# Patient Record
Sex: Female | Born: 1940 | Race: White | Hispanic: No | Smoking: Never smoker
Health system: Southern US, Community
[De-identification: ages and names within clinical notes are randomized; demographics above are authoritative.]

## PROBLEM LIST (undated history)

## (undated) DIAGNOSIS — D351 Benign neoplasm of parathyroid gland: Secondary | ICD-10-CM

## (undated) DIAGNOSIS — K2270 Barrett's esophagus without dysplasia: Secondary | ICD-10-CM

## (undated) DIAGNOSIS — K219 Gastro-esophageal reflux disease without esophagitis: Secondary | ICD-10-CM

## (undated) DIAGNOSIS — Z9882 Breast implant status: Secondary | ICD-10-CM

## (undated) DIAGNOSIS — K635 Polyp of colon: Secondary | ICD-10-CM

## (undated) DIAGNOSIS — J449 Chronic obstructive pulmonary disease, unspecified: Secondary | ICD-10-CM

## (undated) DIAGNOSIS — I10 Essential (primary) hypertension: Secondary | ICD-10-CM

## (undated) DIAGNOSIS — E785 Hyperlipidemia, unspecified: Secondary | ICD-10-CM

## (undated) HISTORY — PX: UPPER GI ENDOSCOPY: SHX6162

## (undated) HISTORY — PX: HEMORRHOID SURGERY: SHX153

## (undated) HISTORY — DX: Barrett's esophagus without dysplasia: K22.70

## (undated) HISTORY — DX: Chronic obstructive pulmonary disease, unspecified: J44.9

## (undated) HISTORY — DX: Hyperlipidemia, unspecified: E78.5

## (undated) HISTORY — DX: Polyp of colon: K63.5

## (undated) HISTORY — DX: Essential (primary) hypertension: I10

## (undated) HISTORY — DX: Breast implant status: Z98.82

## (undated) HISTORY — PX: PLACEMENT OF BREAST IMPLANTS: SHX6334

## (undated) HISTORY — PX: PARATHYROIDECTOMY: SHX19

## (undated) HISTORY — DX: Benign neoplasm of parathyroid gland: D35.1

## (undated) HISTORY — DX: Gastro-esophageal reflux disease without esophagitis: K21.9

---

## 1998-11-27 ENCOUNTER — Other Ambulatory Visit: Admission: RE | Admit: 1998-11-27 | Discharge: 1998-11-27 | Payer: Self-pay | Admitting: *Deleted

## 1998-12-15 ENCOUNTER — Encounter: Payer: Self-pay | Admitting: *Deleted

## 1998-12-17 ENCOUNTER — Ambulatory Visit (HOSPITAL_COMMUNITY): Admission: RE | Admit: 1998-12-17 | Discharge: 1998-12-17 | Payer: Self-pay | Admitting: *Deleted

## 2007-10-02 ENCOUNTER — Ambulatory Visit: Payer: Self-pay | Admitting: Physician Assistant

## 2007-12-25 ENCOUNTER — Ambulatory Visit: Payer: Self-pay | Admitting: Unknown Physician Specialty

## 2009-02-16 ENCOUNTER — Ambulatory Visit: Payer: Self-pay | Admitting: Physician Assistant

## 2010-03-26 ENCOUNTER — Ambulatory Visit: Payer: Self-pay | Admitting: Physician Assistant

## 2010-12-06 ENCOUNTER — Ambulatory Visit: Payer: Self-pay | Admitting: Family Medicine

## 2013-05-23 HISTORY — PX: COLONOSCOPY: SHX174

## 2013-08-08 ENCOUNTER — Ambulatory Visit: Payer: Self-pay | Admitting: Unknown Physician Specialty

## 2013-08-10 LAB — PATHOLOGY REPORT

## 2013-10-25 ENCOUNTER — Ambulatory Visit: Payer: Self-pay | Admitting: Physician Assistant

## 2014-06-03 DIAGNOSIS — H524 Presbyopia: Secondary | ICD-10-CM | POA: Diagnosis not present

## 2014-06-03 DIAGNOSIS — H35033 Hypertensive retinopathy, bilateral: Secondary | ICD-10-CM | POA: Diagnosis not present

## 2014-06-03 DIAGNOSIS — H40013 Open angle with borderline findings, low risk, bilateral: Secondary | ICD-10-CM | POA: Diagnosis not present

## 2014-06-03 DIAGNOSIS — H25013 Cortical age-related cataract, bilateral: Secondary | ICD-10-CM | POA: Diagnosis not present

## 2014-06-03 DIAGNOSIS — H2513 Age-related nuclear cataract, bilateral: Secondary | ICD-10-CM | POA: Diagnosis not present

## 2014-07-22 DIAGNOSIS — H02839 Dermatochalasis of unspecified eye, unspecified eyelid: Secondary | ICD-10-CM | POA: Diagnosis not present

## 2014-07-22 DIAGNOSIS — H18412 Arcus senilis, left eye: Secondary | ICD-10-CM | POA: Diagnosis not present

## 2014-07-22 DIAGNOSIS — H2511 Age-related nuclear cataract, right eye: Secondary | ICD-10-CM | POA: Diagnosis not present

## 2014-07-22 DIAGNOSIS — H18411 Arcus senilis, right eye: Secondary | ICD-10-CM | POA: Diagnosis not present

## 2014-09-04 DIAGNOSIS — Z9181 History of falling: Secondary | ICD-10-CM | POA: Diagnosis not present

## 2014-09-04 DIAGNOSIS — E782 Mixed hyperlipidemia: Secondary | ICD-10-CM | POA: Diagnosis not present

## 2014-09-04 DIAGNOSIS — B0229 Other postherpetic nervous system involvement: Secondary | ICD-10-CM | POA: Diagnosis not present

## 2014-09-04 DIAGNOSIS — I10 Essential (primary) hypertension: Secondary | ICD-10-CM | POA: Diagnosis not present

## 2014-09-04 DIAGNOSIS — K2270 Barrett's esophagus without dysplasia: Secondary | ICD-10-CM | POA: Diagnosis not present

## 2014-09-04 DIAGNOSIS — Z79899 Other long term (current) drug therapy: Secondary | ICD-10-CM | POA: Diagnosis not present

## 2014-09-04 DIAGNOSIS — Z1389 Encounter for screening for other disorder: Secondary | ICD-10-CM | POA: Diagnosis not present

## 2014-09-08 DIAGNOSIS — H25011 Cortical age-related cataract, right eye: Secondary | ICD-10-CM | POA: Diagnosis not present

## 2014-09-08 DIAGNOSIS — H2511 Age-related nuclear cataract, right eye: Secondary | ICD-10-CM | POA: Diagnosis not present

## 2014-09-08 DIAGNOSIS — H52201 Unspecified astigmatism, right eye: Secondary | ICD-10-CM | POA: Diagnosis not present

## 2014-09-08 DIAGNOSIS — H269 Unspecified cataract: Secondary | ICD-10-CM | POA: Diagnosis not present

## 2014-09-09 DIAGNOSIS — H2512 Age-related nuclear cataract, left eye: Secondary | ICD-10-CM | POA: Diagnosis not present

## 2014-09-26 DIAGNOSIS — H269 Unspecified cataract: Secondary | ICD-10-CM | POA: Diagnosis not present

## 2014-09-26 DIAGNOSIS — H2513 Age-related nuclear cataract, bilateral: Secondary | ICD-10-CM | POA: Diagnosis not present

## 2014-09-26 DIAGNOSIS — H2512 Age-related nuclear cataract, left eye: Secondary | ICD-10-CM | POA: Diagnosis not present

## 2014-09-26 DIAGNOSIS — H52202 Unspecified astigmatism, left eye: Secondary | ICD-10-CM | POA: Diagnosis not present

## 2015-03-11 DIAGNOSIS — Z139 Encounter for screening, unspecified: Secondary | ICD-10-CM | POA: Diagnosis not present

## 2015-03-11 DIAGNOSIS — Z23 Encounter for immunization: Secondary | ICD-10-CM | POA: Diagnosis not present

## 2015-03-11 DIAGNOSIS — I10 Essential (primary) hypertension: Secondary | ICD-10-CM | POA: Diagnosis not present

## 2015-03-11 DIAGNOSIS — Z1389 Encounter for screening for other disorder: Secondary | ICD-10-CM | POA: Diagnosis not present

## 2015-03-11 DIAGNOSIS — Z0000 Encounter for general adult medical examination without abnormal findings: Secondary | ICD-10-CM | POA: Diagnosis not present

## 2015-03-11 DIAGNOSIS — E782 Mixed hyperlipidemia: Secondary | ICD-10-CM | POA: Diagnosis not present

## 2015-03-11 DIAGNOSIS — Z6831 Body mass index (BMI) 31.0-31.9, adult: Secondary | ICD-10-CM | POA: Diagnosis not present

## 2015-09-11 DIAGNOSIS — E663 Overweight: Secondary | ICD-10-CM | POA: Diagnosis not present

## 2015-09-11 DIAGNOSIS — E782 Mixed hyperlipidemia: Secondary | ICD-10-CM | POA: Diagnosis not present

## 2015-09-11 DIAGNOSIS — Z79899 Other long term (current) drug therapy: Secondary | ICD-10-CM | POA: Diagnosis not present

## 2015-09-11 DIAGNOSIS — K2270 Barrett's esophagus without dysplasia: Secondary | ICD-10-CM | POA: Diagnosis not present

## 2015-09-11 DIAGNOSIS — Z6829 Body mass index (BMI) 29.0-29.9, adult: Secondary | ICD-10-CM | POA: Diagnosis not present

## 2015-09-11 DIAGNOSIS — B0229 Other postherpetic nervous system involvement: Secondary | ICD-10-CM | POA: Diagnosis not present

## 2015-09-11 DIAGNOSIS — I10 Essential (primary) hypertension: Secondary | ICD-10-CM | POA: Diagnosis not present

## 2015-10-14 DIAGNOSIS — D72819 Decreased white blood cell count, unspecified: Secondary | ICD-10-CM | POA: Diagnosis not present

## 2016-03-23 DIAGNOSIS — I10 Essential (primary) hypertension: Secondary | ICD-10-CM | POA: Diagnosis not present

## 2016-03-23 DIAGNOSIS — B0229 Other postherpetic nervous system involvement: Secondary | ICD-10-CM | POA: Diagnosis not present

## 2016-03-23 DIAGNOSIS — Z79899 Other long term (current) drug therapy: Secondary | ICD-10-CM | POA: Diagnosis not present

## 2016-03-23 DIAGNOSIS — Z9181 History of falling: Secondary | ICD-10-CM | POA: Diagnosis not present

## 2016-03-23 DIAGNOSIS — E782 Mixed hyperlipidemia: Secondary | ICD-10-CM | POA: Diagnosis not present

## 2016-03-23 DIAGNOSIS — Z6830 Body mass index (BMI) 30.0-30.9, adult: Secondary | ICD-10-CM | POA: Diagnosis not present

## 2016-03-23 DIAGNOSIS — M542 Cervicalgia: Secondary | ICD-10-CM | POA: Diagnosis not present

## 2016-03-23 DIAGNOSIS — Z0000 Encounter for general adult medical examination without abnormal findings: Secondary | ICD-10-CM | POA: Diagnosis not present

## 2016-03-23 DIAGNOSIS — K2270 Barrett's esophagus without dysplasia: Secondary | ICD-10-CM | POA: Diagnosis not present

## 2016-03-23 DIAGNOSIS — Z1389 Encounter for screening for other disorder: Secondary | ICD-10-CM | POA: Diagnosis not present

## 2016-03-23 DIAGNOSIS — Z23 Encounter for immunization: Secondary | ICD-10-CM | POA: Diagnosis not present

## 2016-03-23 DIAGNOSIS — Z7689 Persons encountering health services in other specified circumstances: Secondary | ICD-10-CM | POA: Diagnosis not present

## 2016-03-23 DIAGNOSIS — Z139 Encounter for screening, unspecified: Secondary | ICD-10-CM | POA: Diagnosis not present

## 2016-04-27 DIAGNOSIS — E611 Iron deficiency: Secondary | ICD-10-CM | POA: Diagnosis not present

## 2016-09-21 DIAGNOSIS — Z79899 Other long term (current) drug therapy: Secondary | ICD-10-CM | POA: Diagnosis not present

## 2016-09-21 DIAGNOSIS — K2270 Barrett's esophagus without dysplasia: Secondary | ICD-10-CM | POA: Diagnosis not present

## 2016-09-21 DIAGNOSIS — I10 Essential (primary) hypertension: Secondary | ICD-10-CM | POA: Diagnosis not present

## 2016-09-21 DIAGNOSIS — M542 Cervicalgia: Secondary | ICD-10-CM | POA: Diagnosis not present

## 2016-09-21 DIAGNOSIS — B0229 Other postherpetic nervous system involvement: Secondary | ICD-10-CM | POA: Diagnosis not present

## 2016-09-21 DIAGNOSIS — E782 Mixed hyperlipidemia: Secondary | ICD-10-CM | POA: Diagnosis not present

## 2016-12-19 DIAGNOSIS — L729 Follicular cyst of the skin and subcutaneous tissue, unspecified: Secondary | ICD-10-CM | POA: Diagnosis not present

## 2016-12-19 DIAGNOSIS — Z6829 Body mass index (BMI) 29.0-29.9, adult: Secondary | ICD-10-CM | POA: Diagnosis not present

## 2016-12-20 ENCOUNTER — Encounter: Payer: Self-pay | Admitting: *Deleted

## 2016-12-22 ENCOUNTER — Ambulatory Visit (INDEPENDENT_AMBULATORY_CARE_PROVIDER_SITE_OTHER): Payer: PPO | Admitting: General Surgery

## 2016-12-22 ENCOUNTER — Encounter: Payer: Self-pay | Admitting: General Surgery

## 2016-12-22 VITALS — BP 124/82 | HR 87 | Resp 14 | Ht 64.0 in | Wt 167.0 lb

## 2016-12-22 DIAGNOSIS — L02212 Cutaneous abscess of back [any part, except buttock]: Secondary | ICD-10-CM | POA: Diagnosis not present

## 2016-12-22 NOTE — Patient Instructions (Addendum)
Use prescription pain medication if needed. Use Ibuprofen twice daily for the next several days.  Change dressing daily after your shower. May use plain gauze and tape in place. We will call with culture results. Follow up in 10 days.

## 2016-12-22 NOTE — Progress Notes (Signed)
Patient ID: Cindy Macdonald, female   DOB: 1940/08/26, 76 y.o.   MRN: [id number]  Chief Complaint  Patient presents with  . Other    back cyst    HPI Cindy Macdonald is a 76 y.o. female she is here for assessment of a cyst on her back. It was small and there for several years. On 12/15/16 it got bigger and started to expand. Very painful. She was seen by her PCP on 12/19/16 and placed on an antibiotic. She reports no drainage. She is here today with her sister.    HPI  Past Medical History:  Diagnosis Date  . Barrett's esophagus   . Benign tumor of parathyroid gland   . Colon polyps   . COPD (chronic obstructive pulmonary disease) ([place])   . GERD (gastroesophageal reflux disease)   . Hyperlipidemia   . Hypertension     Past Surgical History:  Procedure Laterality Date  . COLONOSCOPY  2015   Dr [name]  . UPPER GI ENDOSCOPY     dr Vira Agar    Family History  Problem Relation Age of Onset  . Lung cancer Father   . Brain cancer Brother   . Lung cancer Paternal Grandfather   . Lymphoma Sister   . Leukemia Sister   . Cancer Brother   . Lymphoma Brother     Social History Social History  Substance Use Topics  . Smoking status: Not on file  . Smokeless tobacco: Not on file  . Alcohol use Not on file    Allergies  Allergen Reactions  . Ace Inhibitors   . Codeine   . Other     Darvocet   . Sulfa Antibiotics     Current Outpatient Prescriptions  Medication Sig Dispense Refill  . albuterol (PROVENTIL HFA;VENTOLIN HFA) 108 (90 Base) MCG/ACT inhaler Inhale into the lungs every 6 (six) hours as needed for wheezing or shortness of breath.    Marland Kitchen amitriptyline (ELAVIL) 25 MG tablet Take 25 mg by mouth at bedtime.    Marland Kitchen amLODipine (NORVASC) 10 MG tablet Take 10 mg by mouth daily.    . ferrous sulfate 325 (65 FE) MG tablet Take 325 mg by mouth daily with breakfast.    . gabapentin (NEURONTIN) 600 MG tablet Take 600 mg by mouth 2 (two) times daily.    . hydrochlorothiazide  (HYDRODIURIL) 50 MG tablet Take 50 mg by mouth daily.    . lansoprazole (PREVACID) 30 MG capsule Take 30 mg by mouth daily at 12 noon.    Marland Kitchen losartan (COZAAR) 100 MG tablet Take 100 mg by mouth daily.    . metoprolol tartrate (LOPRESSOR) 100 MG tablet Take 100 mg by mouth 2 (two) times daily.    . pravastatin (PRAVACHOL) 40 MG tablet Take 40 mg by mouth daily.    Marland Kitchen tiZANidine (ZANAFLEX) 4 MG tablet Take 4 mg by mouth every 6 (six) hours as needed for muscle spasms.    . traMADol (ULTRAM) 50 MG tablet Take 50 mg by mouth every 6 (six) hours as needed.    Marland Kitchen amoxicillin-clavulanate (AUGMENTIN) 875-125 MG tablet      No current facility-administered medications for this visit.     Review of Systems Review of Systems  Constitutional: Negative.   Respiratory: Negative.   Cardiovascular: Negative.     Blood pressure 124/82, pulse 87, resp. rate 14, height 5\' 4"  (1.626 m), weight 167 lb (75.8 kg).  Physical Exam Physical Exam  Constitutional: She is oriented to person,  place, and time. She appears well-developed and well-nourished.  Eyes: Conjunctivae are normal. No scleral icterus.  Neck: Neck supple.  Cardiovascular: Normal rate, regular rhythm and normal heart sounds.   Pulmonary/Chest: Effort normal and breath sounds normal.  Lymphadenopathy:    She has no cervical adenopathy.    She has no axillary adenopathy.  Neurological: She is alert and oriented to person, place, and time.  Skin: Skin is warm and dry.        Psychiatric: She has a normal mood and affect.    Data Reviewed    Assessment    Abscess of mid upper back, history suggestive of prior cyst in this location.     Plan   recommended drainage of the abscess and the patient was agreeable.  The most fluctuant part with alcohol prep a 3 mL of 0.5% Marcaine mixed with 1% Xylocaine was instilled. Sterile towels were placed. 1 cm cruciate incision was made and the corners were trimmed off to allow for drainage of from  yellowish-white pus approximately 10-15 mL. The cavity was irrigated with saline. Culture obtained. Area dressed with 4 x 4's and tape. Patient to continue with the Augmentin which she is on now. This can be adjusted based on culture report Prescription given for Tramadol    HPI, Physical Exam, Assessment and Plan have been scribed under the direction and in the presence of Mckinley Jewel, MD  Concepcion Living, LPN I have completed the exam and reviewed the above documentation for accuracy and completeness.  I agree with the above.  Haematologist has been used and any errors in dictation or transcription are unintentional.  Seeplaputhur G. Jamal Collin, M.D., F.A.C.S.    Junie Panning G 12/23/2016, 8:56 AM

## 2016-12-29 LAB — ANAEROBIC AND AEROBIC CULTURE

## 2017-01-03 ENCOUNTER — Encounter: Payer: Self-pay | Admitting: General Surgery

## 2017-01-03 ENCOUNTER — Ambulatory Visit (INDEPENDENT_AMBULATORY_CARE_PROVIDER_SITE_OTHER): Payer: PPO | Admitting: General Surgery

## 2017-01-03 VITALS — BP 122/80 | HR 76 | Resp 12 | Ht 64.0 in | Wt 169.0 lb

## 2017-01-03 DIAGNOSIS — L02212 Cutaneous abscess of back [any part, except buttock]: Secondary | ICD-10-CM | POA: Diagnosis not present

## 2017-01-03 NOTE — Progress Notes (Signed)
Patient ID: Cindy Macdonald, female   DOB: 1941/03/15, 76 y.o.   MRN: [id number]  Chief Complaint  Patient presents with  . Follow-up    HPI Cindy Macdonald is a 76 y.o. female here today for her 2 week follow up back abscess. Patient states the area is draining. She states there is another area that has opened up right beside the one that was drained.   HPI  Past Medical History:  Diagnosis Date  . Barrett's esophagus   . Benign tumor of parathyroid gland   . Colon polyps   . COPD (chronic obstructive pulmonary disease) ([place])   . GERD (gastroesophageal reflux disease)   . Hyperlipidemia   . Hypertension     Past Surgical History:  Procedure Laterality Date  . COLONOSCOPY  2015   Dr [name]  . UPPER GI ENDOSCOPY     dr Vira Agar    Family History  Problem Relation Age of Onset  . Lung cancer Father   . Brain cancer Brother   . Lung cancer Paternal Grandfather   . Lymphoma Sister   . Leukemia Sister   . Cancer Brother   . Lymphoma Brother     Social History Social History  Substance Use Topics  . Smoking status: Never Smoker  . Smokeless tobacco: Never Used  . Alcohol use No    Allergies  Allergen Reactions  . Ace Inhibitors   . Codeine   . Other     Darvocet   . Sulfa Antibiotics     Current Outpatient Prescriptions  Medication Sig Dispense Refill  . albuterol (PROVENTIL HFA;VENTOLIN HFA) 108 (90 Base) MCG/ACT inhaler Inhale into the lungs every 6 (six) hours as needed for wheezing or shortness of breath.    Marland Kitchen amitriptyline (ELAVIL) 25 MG tablet Take 25 mg by mouth at bedtime.    Marland Kitchen amLODipine (NORVASC) 10 MG tablet Take 10 mg by mouth daily.    Marland Kitchen amoxicillin-clavulanate (AUGMENTIN) 875-125 MG tablet     . ferrous sulfate 325 (65 FE) MG tablet Take 325 mg by mouth daily with breakfast.    . gabapentin (NEURONTIN) 600 MG tablet Take 600 mg by mouth 2 (two) times daily.    . hydrochlorothiazide (HYDRODIURIL) 50 MG tablet Take 50 mg by mouth daily.    .  lansoprazole (PREVACID) 30 MG capsule Take 30 mg by mouth daily at 12 noon.    Marland Kitchen losartan (COZAAR) 100 MG tablet Take 100 mg by mouth daily.    . metoprolol tartrate (LOPRESSOR) 100 MG tablet Take 100 mg by mouth 2 (two) times daily.    . pravastatin (PRAVACHOL) 40 MG tablet Take 40 mg by mouth daily.    Marland Kitchen tiZANidine (ZANAFLEX) 4 MG tablet Take 4 mg by mouth every 6 (six) hours as needed for muscle spasms.    . traMADol (ULTRAM) 50 MG tablet Take 50 mg by mouth every 6 (six) hours as needed.     No current facility-administered medications for this visit.     Review of Systems Review of Systems  Constitutional: Negative.   Respiratory: Negative.     Blood pressure 122/80, pulse 76, resp. rate 12, height 5\' 4"  (1.626 m), weight 169 lb (76.7 kg).  Physical Exam Physical Exam  Constitutional: She is oriented to person, place, and time. She appears well-developed and well-nourished.  Pulmonary/Chest:      Neurological: She is alert and oriented to person, place, and time.  Skin: Skin is warm and dry.  Data Reviewed Prior notes reviewed   Assessment    Abscess of back - culture grew out streptococcus, pt has finished course of amoxicillin. Area continues to drain small amounts of purulent fluid. Recommend that pt return in 4-5 weeks to evaluate for possible excision once inflammation has subsided.       Plan   Return in 4-5 weeks for reevaluation and possible excision  Pt to keep area clean, use hot compresses      HPI, Physical Exam, Assessment and Plan have been scribed under the direction and in the presence of Mckinley Jewel, MD  Gaspar Cola, CMA   [facility] G 01/03/2017, 11:51 AM

## 2017-01-03 NOTE — Patient Instructions (Addendum)
Return in 4-5 weeks for reevaluation and possible excision   Keep area clean, use hot compresses

## 2017-02-07 ENCOUNTER — Ambulatory Visit (INDEPENDENT_AMBULATORY_CARE_PROVIDER_SITE_OTHER): Payer: PPO | Admitting: General Surgery

## 2017-02-07 ENCOUNTER — Encounter: Payer: Self-pay | Admitting: General Surgery

## 2017-02-07 VITALS — BP 126/80 | HR 70 | Resp 12 | Ht 64.0 in | Wt 170.0 lb

## 2017-02-07 DIAGNOSIS — L729 Follicular cyst of the skin and subcutaneous tissue, unspecified: Secondary | ICD-10-CM

## 2017-02-07 NOTE — Progress Notes (Signed)
Patient ID: Cindy Macdonald, female   DOB: Oct 11, 1940, 76 y.o.   MRN: [id number]  Chief Complaint  Patient presents with  . Routine Post Op    HPI Cindy Macdonald is a 76 y.o. female.  Here today for follow up back abscess that was drained on 12/22/2016.  She states the area is healed and feels good.  HPI  Past Medical History:  Diagnosis Date  . Barrett's esophagus   . Benign tumor of parathyroid gland   . Colon polyps   . COPD (chronic obstructive pulmonary disease) ([place])   . GERD (gastroesophageal reflux disease)   . Hyperlipidemia   . Hypertension     Past Surgical History:  Procedure Laterality Date  . COLONOSCOPY  2015   Dr [name]  . UPPER GI ENDOSCOPY     dr Vira Agar    Family History  Problem Relation Age of Onset  . Lung cancer Father   . Brain cancer Brother   . Lung cancer Paternal Grandfather   . Lymphoma Sister   . Leukemia Sister   . Cancer Brother   . Lymphoma Brother     Social History Social History  Substance Use Topics  . Smoking status: Never Smoker  . Smokeless tobacco: Never Used  . Alcohol use No    Allergies  Allergen Reactions  . Ace Inhibitors   . Codeine   . Other     Darvocet   . Sulfa Antibiotics     Current Outpatient Prescriptions  Medication Sig Dispense Refill  . albuterol (PROVENTIL HFA;VENTOLIN HFA) 108 (90 Base) MCG/ACT inhaler Inhale into the lungs every 6 (six) hours as needed for wheezing or shortness of breath.    Marland Kitchen amitriptyline (ELAVIL) 25 MG tablet Take 25 mg by mouth at bedtime.    Marland Kitchen amLODipine (NORVASC) 10 MG tablet Take 10 mg by mouth daily.    . ferrous sulfate 325 (65 FE) MG tablet Take 325 mg by mouth daily with breakfast.    . gabapentin (NEURONTIN) 600 MG tablet Take 600 mg by mouth 2 (two) times daily.    . hydrochlorothiazide (HYDRODIURIL) 50 MG tablet Take 50 mg by mouth daily.    . lansoprazole (PREVACID) 30 MG capsule Take 30 mg by mouth daily at 12 noon.    Marland Kitchen losartan (COZAAR) 100 MG tablet Take 100  mg by mouth daily.    . metoprolol tartrate (LOPRESSOR) 100 MG tablet Take 100 mg by mouth 2 (two) times daily.    . pravastatin (PRAVACHOL) 40 MG tablet Take 40 mg by mouth daily.    Marland Kitchen tiZANidine (ZANAFLEX) 4 MG tablet Take 4 mg by mouth every 6 (six) hours as needed for muscle spasms.    . traMADol (ULTRAM) 50 MG tablet Take 50 mg by mouth every 6 (six) hours as needed.     No current facility-administered medications for this visit.     Review of Systems Review of Systems  Constitutional: Negative.   Respiratory: Negative.   Cardiovascular: Negative.     Blood pressure 126/80, pulse 70, resp. rate 12, height 5\' 4"  (1.626 m), weight 170 lb (77.1 kg).  Physical Exam Physical Exam  Constitutional: She is oriented to person, place, and time. She appears well-developed and well-nourished.  Pulmonary/Chest:      Neurological: She is alert and oriented to person, place, and time.  Skin: Skin is warm and dry.    Data Reviewed Prior notes reviewed  Assessment    Back cyst with abscess,  post drainage, well healed. No residual cyst palpated today.     Plan      Return to clinic if patient feels recurrence of cyst.    I have completed the exam and reviewed the above documentation for accuracy and completeness.  I agree with the above.  Haematologist has been used and any errors in dictation or transcription are unintentional.  Taishawn Smaldone G. Jamal Collin, M.D., F.A.C.S.   Junie Panning G 02/07/2017, 4:24 PM

## 2017-02-07 NOTE — Patient Instructions (Signed)
Return if needed

## 2017-04-10 DIAGNOSIS — E782 Mixed hyperlipidemia: Secondary | ICD-10-CM | POA: Diagnosis not present

## 2017-04-10 DIAGNOSIS — K2270 Barrett's esophagus without dysplasia: Secondary | ICD-10-CM | POA: Diagnosis not present

## 2017-04-10 DIAGNOSIS — M542 Cervicalgia: Secondary | ICD-10-CM | POA: Diagnosis not present

## 2017-04-10 DIAGNOSIS — E611 Iron deficiency: Secondary | ICD-10-CM | POA: Diagnosis not present

## 2017-04-10 DIAGNOSIS — I10 Essential (primary) hypertension: Secondary | ICD-10-CM | POA: Diagnosis not present

## 2017-04-10 DIAGNOSIS — Z6829 Body mass index (BMI) 29.0-29.9, adult: Secondary | ICD-10-CM | POA: Diagnosis not present

## 2017-04-10 DIAGNOSIS — B0229 Other postherpetic nervous system involvement: Secondary | ICD-10-CM | POA: Diagnosis not present

## 2017-04-10 DIAGNOSIS — Z23 Encounter for immunization: Secondary | ICD-10-CM | POA: Diagnosis not present

## 2017-09-19 DIAGNOSIS — Z1331 Encounter for screening for depression: Secondary | ICD-10-CM | POA: Diagnosis not present

## 2017-09-19 DIAGNOSIS — Z9181 History of falling: Secondary | ICD-10-CM | POA: Diagnosis not present

## 2017-09-19 DIAGNOSIS — E782 Mixed hyperlipidemia: Secondary | ICD-10-CM | POA: Diagnosis not present

## 2017-09-19 DIAGNOSIS — Z139 Encounter for screening, unspecified: Secondary | ICD-10-CM | POA: Diagnosis not present

## 2017-09-19 DIAGNOSIS — Z7689 Persons encountering health services in other specified circumstances: Secondary | ICD-10-CM | POA: Diagnosis not present

## 2017-09-19 DIAGNOSIS — I10 Essential (primary) hypertension: Secondary | ICD-10-CM | POA: Diagnosis not present

## 2017-09-19 DIAGNOSIS — Z1231 Encounter for screening mammogram for malignant neoplasm of breast: Secondary | ICD-10-CM | POA: Diagnosis not present

## 2017-09-25 DIAGNOSIS — K449 Diaphragmatic hernia without obstruction or gangrene: Secondary | ICD-10-CM | POA: Diagnosis not present

## 2017-09-25 DIAGNOSIS — I10 Essential (primary) hypertension: Secondary | ICD-10-CM | POA: Diagnosis not present

## 2017-09-25 DIAGNOSIS — K8020 Calculus of gallbladder without cholecystitis without obstruction: Secondary | ICD-10-CM | POA: Diagnosis not present

## 2017-09-27 ENCOUNTER — Emergency Department (HOSPITAL_COMMUNITY)
Admission: EM | Admit: 2017-09-27 | Discharge: 2017-09-27 | Disposition: A | Discharge status: Home / Self Care | Payer: PPO | Attending: Emergency Medicine | Admitting: Emergency Medicine

## 2017-09-27 ENCOUNTER — Emergency Department (HOSPITAL_COMMUNITY): Payer: PPO

## 2017-09-27 ENCOUNTER — Encounter (HOSPITAL_COMMUNITY): Payer: Self-pay | Admitting: Emergency Medicine

## 2017-09-27 DIAGNOSIS — R5383 Other fatigue: Secondary | ICD-10-CM | POA: Diagnosis not present

## 2017-09-27 DIAGNOSIS — J449 Chronic obstructive pulmonary disease, unspecified: Secondary | ICD-10-CM | POA: Diagnosis not present

## 2017-09-27 DIAGNOSIS — Z79899 Other long term (current) drug therapy: Secondary | ICD-10-CM | POA: Diagnosis not present

## 2017-09-27 DIAGNOSIS — R531 Weakness: Secondary | ICD-10-CM | POA: Diagnosis not present

## 2017-09-27 DIAGNOSIS — R110 Nausea: Secondary | ICD-10-CM | POA: Diagnosis not present

## 2017-09-27 DIAGNOSIS — I10 Essential (primary) hypertension: Secondary | ICD-10-CM | POA: Diagnosis not present

## 2017-09-27 DIAGNOSIS — R404 Transient alteration of awareness: Secondary | ICD-10-CM | POA: Diagnosis not present

## 2017-09-27 DIAGNOSIS — R0602 Shortness of breath: Secondary | ICD-10-CM | POA: Diagnosis not present

## 2017-09-27 LAB — COMPREHENSIVE METABOLIC PANEL
ALBUMIN: 4.5 g/dL (ref 3.5–5.0)
ALK PHOS: 58 U/L (ref 38–126)
ALT: 19 U/L (ref 14–54)
AST: 23 U/L (ref 15–41)
Anion gap: 10 (ref 5–15)
BILIRUBIN TOTAL: 0.6 mg/dL (ref 0.3–1.2)
BUN: 11 mg/dL (ref 6–20)
CALCIUM: 9.8 mg/dL (ref 8.9–10.3)
CO2: 27 mmol/L (ref 22–32)
Chloride: 99 mmol/L — ABNORMAL LOW (ref 101–111)
Creatinine, Ser: 0.73 mg/dL (ref 0.44–1.00)
GFR calc Af Amer: >60 mL/min (ref >60)
GFR calc non Af Amer: >60 mL/min (ref >60)
GLUCOSE: 107 mg/dL — AB (ref 65–99)
Potassium: 3.5 mmol/L (ref 3.5–5.1)
Sodium: 136 mmol/L (ref 135–145)
TOTAL PROTEIN: 7.5 g/dL (ref 6.5–8.1)

## 2017-09-27 LAB — URINALYSIS, ROUTINE W REFLEX MICROSCOPIC
Bilirubin Urine: NEGATIVE
GLUCOSE, UA: NEGATIVE mg/dL
Hgb urine dipstick: NEGATIVE
KETONES UR: 5 mg/dL — AB
LEUKOCYTES UA: NEGATIVE
NITRITE: NEGATIVE
PH: 8 (ref 5.0–8.0)
Protein, ur: NEGATIVE mg/dL
SPECIFIC GRAVITY, URINE: 1.004 — AB (ref 1.005–1.030)

## 2017-09-27 LAB — CBC
HEMATOCRIT: 43.6 % (ref 36.0–46.0)
Hemoglobin: 14.3 g/dL (ref 12.0–15.0)
MCH: 27.8 pg (ref 26.0–34.0)
MCHC: 32.8 g/dL (ref 30.0–36.0)
MCV: 84.7 fL (ref 78.0–100.0)
Platelets: 294 10*3/uL (ref 150–400)
RBC: 5.15 MIL/uL — ABNORMAL HIGH (ref 3.87–5.11)
RDW: 13.7 % (ref 11.5–15.5)
WBC: 3.2 10*3/uL — ABNORMAL LOW (ref 4.0–10.5)

## 2017-09-27 NOTE — ED Provider Notes (Signed)
[facility] EM[facility] Provider Note   CSN: [id number] Arrival date & time: 09/27/17  1959  History   Chief Complaint Chief Complaint  Patient presents with  . Gen. Weakness/Fatigue    Hypertensive    HPI Cindy Macdonald is a 77 y.o. female with a past medical history of hypertension, COPD, hyperlipidemia, GERD who presented to the ED with complaints of generalized fatigue and weakness.  Daughter present who assists with history  She reports progressive general symptoms of fatigue, nausea, headache, and generalized weakness for the last several days which worsened this morning.  She denies fever, cough, abdominal pain, vomiting.  Does note some shortness of breath, tingling.  She attributes her symptoms to her blood pressure and the addition of hydralazine to her antihypertensive regimen 5-6 days ago.  She has a history of hypertension and she reports fluctuations in her blood pressure for the last 3-4 weeks.  She has a written blood pressure log with values ranging from 110s to 170s over a day.  They state her blood pressure was well controlled on multidrug regimen until recently.  Also had MRI for evaluation of renal artery stenosis which was negative.   Past Medical History:  Diagnosis Date  . Barrett's esophagus   . Benign tumor of parathyroid gland   . Colon polyps   . COPD (chronic obstructive pulmonary disease) ([place])   . GERD (gastroesophageal reflux disease)   . Hyperlipidemia   . Hypertension     There are no active problems to display for this patient.   Past Surgical History:  Procedure Laterality Date  . COLONOSCOPY  2015   Dr [name]  . UPPER GI ENDOSCOPY     dr Vira Agar     OB History    Gravida      Para      Term      Preterm      AB      Living  3     SAB      TAB      Ectopic      Multiple      Live Births           Obstetric Comments  Menstrual age: 43  Age 1st Pregnancy: 82          Home  Medications    Prior to Admission medications   Medication Sig Start Date End Date Taking? Authorizing Provider  albuterol (PROVENTIL HFA;VENTOLIN HFA) 108 (90 Base) MCG/ACT inhaler Inhale into the lungs every 6 (six) hours as needed for wheezing or shortness of breath.   Yes [provider]  amitriptyline (ELAVIL) 25 MG tablet Take 12.5 mg by mouth at bedtime.    Yes [provider]  gabapentin (NEURONTIN) 600 MG tablet Take 1,200 mg by mouth at bedtime.    Yes [provider]  hydrALAZINE (APRESOLINE) 25 MG tablet Take 50 mg by mouth 2 (two) times daily. 09/19/17  Yes [provider]  hydrochlorothiazide (HYDRODIURIL) 50 MG tablet Take 50 mg by mouth at bedtime.    Yes [provider]  losartan (COZAAR) 100 MG tablet Take 100 mg by mouth at bedtime.    Yes [provider]  MAGNESIUM PO Take 400 mg by mouth daily.   Yes [provider]  metoprolol tartrate (LOPRESSOR) 100 MG tablet Take 100 mg by mouth 2 (two) times daily.   Yes [provider]  omega-3 acid ethyl esters (LOVAZA) 1 g capsule Take 360  mg by mouth daily.   Yes [provider]  pravastatin (PRAVACHOL) 40 MG tablet Take 40 mg by mouth at bedtime.    Yes [provider]  tiZANidine (ZANAFLEX) 4 MG tablet Take 4 mg by mouth every 6 (six) hours as needed for muscle spasms.   Yes [provider]  vitamin C (ASCORBIC ACID) 500 MG tablet Take 500 mg by mouth daily.   Yes [provider]    Family History Family History  Problem Relation Age of Onset  . Lung cancer Father   . Brain cancer Brother   . Lung cancer Paternal Grandfather   . Lymphoma Sister   . Leukemia Sister   . Cancer Brother   . Lymphoma Brother     Social History Social History   Tobacco Use  . Smoking status: Never Smoker  . Smokeless tobacco: Never Used  Substance Use Topics  . Alcohol use: No  . Drug use: No     Allergies   Ace inhibitors;  Codeine; Other; and Sulfa antibiotics   Review of Systems Review of Systems  Constitutional: Positive for chills and fatigue. Negative for fever.  Cardiovascular: Negative for palpitations and leg swelling.  Gastrointestinal: Positive for nausea. Negative for abdominal pain and vomiting.  Neurological: Positive for weakness.     Physical Exam Updated Vital Signs BP (!) 148/103   Pulse 76   Resp 19   SpO2 99%   General: Elderly woman resting in bed uncomfortable, no acute distress. Pt with chills-not rigors-during exam  Head: Normocephalic, atraumatic  Eyes: EOMI, PERRL ENT: Moist mucus membranes, no cervical lymphadenopathy  CV: RRR  Resp: Clear breath sounds bilaterally, normal work of breathing, no distress  Abd: Soft, +BS, non-tender to palpation  Extr: No LE edema, good peripheral pulses  Neuro: Alert and oriented x3, symmetric and full strength in bilateral upper extremities with encouragement  Skin: Warm, dry     ED Treatments / Results  Labs (all labs ordered are listed, but only abnormal results are displayed) Labs Reviewed  COMPREHENSIVE METABOLIC PANEL - Abnormal; Notable for the following components:      Result Value   Chloride 99 (*)    Glucose, Bld 107 (*)    All other components within normal limits  CBC - Abnormal; Notable for the following components:   WBC 3.2 (*)    RBC 5.15 (*)    All other components within normal limits  URINALYSIS, ROUTINE W REFLEX MICROSCOPIC - Abnormal; Notable for the following components:   Color, Urine COLORLESS (*)    Specific Gravity, Urine 1.004 (*)    Ketones, ur 5 (*)    All other components within normal limits    EKG None  Radiology Dg Chest 2 View  Result Date: 09/27/2017 CLINICAL DATA:  Generalized weakness with fatigue onset today. Dyspnea. EXAM: CHEST - 2 VIEW COMPARISON:  12/06/2010 FINDINGS: Borderline cardiomegaly. Mild aortic atherosclerosis with slightly uncoiled appearance of the aorta. No active  pulmonary disease. No pulmonary edema, effusion or pneumothorax. Moderate-sized hiatal hernia is redemonstrated. Bilateral calcified breast implants are also identified. Spurring off the left humeral head compatible with osteoarthritis of the left glenohumeral joint. IMPRESSION: No active cardiopulmonary disease.  Mild aortic atherosclerosis. Electronically Signed   By: Ashley Royalty M.D.   On: 09/27/2017 21:56    Procedures Procedures (including critical care time)  Medications Ordered in ED Medications - No data to display   Initial Impression / Assessment and Plan / ED Course  I have reviewed the triage vital signs and the nursing notes.  Pertinent labs & imaging results that were available during my care of the patient were reviewed by me and considered in my medical decision making (see chart for details).  77 yo F with history of HTN, GERD, COPD presenting with generalized symptoms which she has attributed to initiation of hydralazine. Based on history, no great clinical features or pattern to suggest an alternate etiology such as infection, metabolic, or other causes. Will obtain labwork to rule out electrolyte abnormalities, end organ dysfunction, U/A, CXR. She was hypertensive on arrival but BP has improved without intervention, down to 148/103.   CBC with slightly low WBC, otherwise wnl, U/A negative for infection, slight ketones. CMP without acute abnormality. Afebrile. CXR also reassuring without acute process, chronic changes. Given normal workup thus far, her sx may be due to adverse medication effect with hydralazine. Pt likely safe to discontinue this medicine with close follow up with PCP for continued evaluation of hypertension and consideration of further investigation if her generalized sx do not improve with cessation of hydralazine  Final Clinical Impressions(s) / ED Diagnoses   Final diagnoses:  Shortness of breath  Fatigue, unspecified type    ED Discharge Orders     None       Tawny Asal, MD 09/27/17 2243    Carmin Muskrat, MD 09/28/17 2241

## 2017-09-27 NOTE — ED Notes (Signed)
Patient transported to X-ray 

## 2017-09-27 NOTE — Discharge Instructions (Addendum)
Nice to meet you Cindy Macdonald. Your lab work, urine, and chest x ray all look reassuring that there is not a serious or dangerous cause of your symptoms. It may be a side effect of the hydralazine given the timing of starting the medication and your symptoms. It will be safe to discontinue this medication and follow up with your primary care doctor for consideration of other medications or strategies to stabilize your blood pressure.

## 2017-09-27 NOTE — ED Triage Notes (Signed)
Patient arrived with EMS from home reports generalized weakness with fatigue onset today , recently started on Hydralazine for hypertension last week , BP = 187/110 by EMS /CBG= 89 .

## 2017-09-28 DIAGNOSIS — Z79899 Other long term (current) drug therapy: Secondary | ICD-10-CM | POA: Diagnosis not present

## 2017-09-28 DIAGNOSIS — I10 Essential (primary) hypertension: Secondary | ICD-10-CM | POA: Diagnosis not present

## 2017-10-06 DIAGNOSIS — I10 Essential (primary) hypertension: Secondary | ICD-10-CM | POA: Diagnosis not present

## 2017-10-06 DIAGNOSIS — Z6829 Body mass index (BMI) 29.0-29.9, adult: Secondary | ICD-10-CM | POA: Diagnosis not present

## 2017-10-06 DIAGNOSIS — R825 Elevated urine levels of drugs, medicaments and biological substances: Secondary | ICD-10-CM | POA: Diagnosis not present

## 2017-10-20 DIAGNOSIS — I10 Essential (primary) hypertension: Secondary | ICD-10-CM | POA: Diagnosis not present

## 2017-10-20 DIAGNOSIS — Z7189 Other specified counseling: Secondary | ICD-10-CM | POA: Diagnosis not present

## 2017-10-20 DIAGNOSIS — Z6829 Body mass index (BMI) 29.0-29.9, adult: Secondary | ICD-10-CM | POA: Diagnosis not present

## 2017-10-20 DIAGNOSIS — R825 Elevated urine levels of drugs, medicaments and biological substances: Secondary | ICD-10-CM | POA: Diagnosis not present

## 2017-10-25 DIAGNOSIS — Z1231 Encounter for screening mammogram for malignant neoplasm of breast: Secondary | ICD-10-CM | POA: Diagnosis not present

## 2017-11-01 ENCOUNTER — Other Ambulatory Visit: Payer: Self-pay | Admitting: Physician Assistant

## 2017-11-01 DIAGNOSIS — R928 Other abnormal and inconclusive findings on diagnostic imaging of breast: Secondary | ICD-10-CM

## 2017-11-15 ENCOUNTER — Other Ambulatory Visit: Payer: Self-pay | Admitting: Physician Assistant

## 2017-11-15 ENCOUNTER — Ambulatory Visit
Admission: RE | Admit: 2017-11-15 | Discharge: 2017-11-15 | Disposition: A | Discharge status: Home / Self Care | Payer: PPO | Source: Ambulatory Visit | Attending: Physician Assistant | Admitting: Physician Assistant

## 2017-11-15 DIAGNOSIS — R928 Other abnormal and inconclusive findings on diagnostic imaging of breast: Secondary | ICD-10-CM

## 2017-11-15 DIAGNOSIS — N6310 Unspecified lump in the right breast, unspecified quadrant: Secondary | ICD-10-CM

## 2017-11-15 DIAGNOSIS — N6489 Other specified disorders of breast: Secondary | ICD-10-CM | POA: Diagnosis not present

## 2017-11-17 ENCOUNTER — Telehealth: Payer: Self-pay | Admitting: Cardiovascular Disease

## 2017-11-17 ENCOUNTER — Ambulatory Visit (INDEPENDENT_AMBULATORY_CARE_PROVIDER_SITE_OTHER): Payer: PPO | Admitting: Cardiovascular Disease

## 2017-11-17 ENCOUNTER — Encounter: Payer: Self-pay | Admitting: Cardiovascular Disease

## 2017-11-17 ENCOUNTER — Encounter

## 2017-11-17 VITALS — BP 140/82 | HR 57 | Ht 64.0 in | Wt 164.2 lb

## 2017-11-17 DIAGNOSIS — I10 Essential (primary) hypertension: Secondary | ICD-10-CM | POA: Diagnosis not present

## 2017-11-17 DIAGNOSIS — E785 Hyperlipidemia, unspecified: Secondary | ICD-10-CM | POA: Diagnosis not present

## 2017-11-17 DIAGNOSIS — R0602 Shortness of breath: Secondary | ICD-10-CM | POA: Diagnosis not present

## 2017-11-17 MED ORDER — CARVEDILOL 12.5 MG PO TABS: 12.5000 mg | ORAL_TABLET | Freq: Two times a day (BID) | ORAL | 3 refills | Status: DC

## 2017-11-17 NOTE — Telephone Encounter (Signed)
Call placed to the patient. She stated that she has been taking the clonidine 0.1 mg as needed only and not daily. She takes the medication when her diastolic gets 95 or above. She wanted to verify that this was okay or if she needs to take it daily with the addition of the carvedilol 12.5 mg bid. She is concerned that this will drop her blood pressure too low. Message has been routed to the provider for further recommendation.

## 2017-11-17 NOTE — Telephone Encounter (Signed)
Continue to take clonidine only as needed not on a regular basis.

## 2017-11-17 NOTE — Patient Instructions (Signed)
Medication Instructions: STOP the Metoprolol START Carvedilol 12.5 mg twice daily  If you need a refill on your cardiac medications before your next appointment, please call your pharmacy.   Procedures/Testing: Your physician has requested that you have an echocardiogram. Echocardiography is a painless test that uses sound waves to create images of your heart. It provides your doctor with information about the size and shape of your heart and how well your heart's chambers and valves are working. You may receive an ultrasound enhancing agent through an IV if needed to better visualize your heart during the echo.This procedure takes approximately one hour. There are no restrictions for this procedure. This will take place at the [facility] clinic.    Follow-Up: Your physician wants you to follow-up in one month with Dr. Fletcher Anon.   Special Instructions: Please call the office in one week with your blood pressure readings at [phone].  Thank you for choosing [facility] at [facility]!

## 2017-11-17 NOTE — Telephone Encounter (Signed)
Please call regarding Clonidine. Pt asks if she still needs to take it.

## 2017-11-17 NOTE — Progress Notes (Signed)
Cardiology Office Note   Date:  11/17/2017   ID:  Cindy Macdonald, DOB 01-28-1941, MRN [id number]  PCP:  Cyndi Bender, PA-C  Cardiologist:   Kathlyn Sacramento, MD   Chief Complaint  Patient presents with  . other    Ref by Fae Pippin for HTN. Meds reviewed by the pt.'s bottles. Pt. c/o shortness of breath with difficulty getting her blood pressure regulated.       History of Present Illness: Cindy Macdonald is a 77 y.o. 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  She has no prior cardiac history.  She has known history of hypertension diagnosed in her early 42s, hyperlipidemia and previous tobacco use.  There is strong family history of hypertension.  Blood pressure has been reasonably controlled up until the last 6 months when it became difficult to control.  She denies any chest pain.  She does complain of increased shortness of breath when her blood pressure is high.  She has no history of sleep apnea.  She occasionally takes ibuprofen for arthritis but not very frequently.  She had elevated serum catecholamine but her symptoms are actually not consistent with pheochromocytoma.  She did not have 24-hour urine collection.  She has been using clonidine as needed for elevated blood pressure.  I reviewed her most recent labs which showed normal renal function with a potassium of 4.1.  She had MRI of the renal arteries which showed no evidence of renal artery stenosis and no other vascular abnormalities.  She had generalized symptoms with hydralazine.  Cortisol level was normal.  TSH was borderline elevated.  T4 was within the normal range.    Past Medical History:  Diagnosis Date  . Barrett's esophagus   . Benign tumor of parathyroid gland   . Colon polyps   . COPD (chronic obstructive pulmonary disease) ([place])   . GERD (gastroesophageal reflux disease)   . Hx of breast implants, bilateral   . Hyperlipidemia   . Hypertension     Past  Surgical History:  Procedure Laterality Date  . COLONOSCOPY  2015   Dr [name]  . HEMORRHOID SURGERY    . PARATHYROIDECTOMY    . UPPER GI ENDOSCOPY     dr Vira Agar     Current Outpatient Medications  Medication Sig Dispense Refill  . albuterol (PROVENTIL HFA;VENTOLIN HFA) 108 (90 Base) MCG/ACT inhaler Inhale into the lungs every 6 (six) hours as needed for wheezing or shortness of breath.    . cloNIDine (CATAPRES) 0.1 MG tablet Take 0.1 mg by mouth daily.  0  . gabapentin (NEURONTIN) 600 MG tablet Take 1,200 mg by mouth at bedtime.     . hydrochlorothiazide (HYDRODIURIL) 50 MG tablet Take 50 mg by mouth at bedtime.     . lansoprazole (PREVACID) 30 MG capsule Take 30 mg by mouth daily.  3  . losartan (COZAAR) 100 MG tablet Take 100 mg by mouth at bedtime.     Marland Kitchen MAGNESIUM PO Take 400 mg by mouth daily.    . metoprolol tartrate (LOPRESSOR) 100 MG tablet Take 100 mg by mouth 2 (two) times daily.    Marland Kitchen omega-3 acid ethyl esters (LOVAZA) 1 g capsule Take 360 mg by mouth daily.    . pravastatin (PRAVACHOL) 40 MG tablet Take 40 mg by mouth at bedtime.     . vitamin C (ASCORBIC ACID) 500 MG tablet Take 500 mg by mouth daily.    . vitamin E 400  UNIT capsule Take 400 Units by mouth daily.     No current facility-administered medications for this visit.     Allergies:   Ace inhibitors; Codeine; Other; and Sulfa antibiotics    Social History:  The patient  reports that she has never smoked. She has never used smokeless tobacco. She reports that she does not drink alcohol or use drugs.   Family History:  The patient's family history includes Brain cancer in her brother; Cancer in her brother; Leukemia in her sister; Lung cancer in her father and paternal grandfather; Lymphoma in her brother and sister.    ROS:  Please see the history of present illness.   Otherwise, review of systems are positive for none.   All other systems are reviewed and negative.    PHYSICAL EXAM: VS:  BP 140/82 (BP  Location: Right Arm, Patient Position: Sitting, Cuff Size: Normal)   Pulse (!) 57   Ht 5\' 4"  (1.626 m)   Wt 164 lb 4 oz (74.5 kg)   BMI 28.19 kg/m  , BMI Body mass index is 28.19 kg/m. GEN: Well nourished, well developed, in no acute distress  HEENT: normal  Neck: no JVD, carotid bruits, or masses Cardiac: RRR; no  rubs, or gallops,no edema .  1 out of 6 systolic murmur in the aortic area Respiratory:  clear to auscultation bilaterally, normal work of breathing GI: soft, nontender, nondistended, + BS MS: no deformity or atrophy  Skin: warm and dry, no rash Neuro:  Strength and sensation are intact Psych: euthymic mood, full affect   EKG:  EKG is ordered today. The ekg ordered today demonstrates sinus bradycardia with no significant ST or T wave changes.   Recent Labs: 09/27/2017: ALT 19; BUN 11; Creatinine, Ser 0.73; Hemoglobin 14.3; Platelets 294; Potassium 3.5; Sodium 136    Lipid Panel No results found for: CHOL, TRIG, HDL, CHOLHDL, VLDL, LDLCALC, LDLDIRECT    Wt Readings from Last 3 Encounters:  11/17/17 164 lb 4 oz (74.5 kg)  02/07/17 170 lb (77.1 kg)  01/03/17 169 lb (76.7 kg)      Other studies Reviewed: Additional studies/ records that were reviewed today include: Old records from primary care physician, testing and labs.. Review of the above records demonstrates: Summarized above  PAD Screen 11/17/2017  Previous PAD dx? No  Previous surgical procedure? No  Pain with walking? No  Feet/toe relief with dangling? No  Painful, non-healing ulcers? No  Extremities discolored? No      ASSESSMENT AND PLAN:  1.  Refractory hypertension: No evidence of secondary hypertension based on testing.  I do not think she has pheochromocytoma given that her presentation is not consistent with that.  It is unlikely that she has hyper aldosteronism at this age.  I suspect that she likely has essential hypertension given her strong family history of this.  I advised her to avoid  NSAIDs.  We should try to get her off clonidine altogether to avoid rebound hypertension. I elected to switch metoprolol to carvedilol for better blood pressure control.  If blood pressure remains elevated, we can consider adding spironolactone.  2.  Dyspnea: This is happening in the setting of uncontrolled hypertension.  She does have a faint cardiac murmur.  I requested an echocardiogram for evaluation.  3. Hyperlipidemia: Currently on pravastatin.   Disposition:   FU with me in 1 month  Signed,  Kathlyn Sacramento, MD  11/17/2017 11:23 AM    [place]

## 2017-11-20 ENCOUNTER — Other Ambulatory Visit: Payer: Self-pay | Admitting: Cardiovascular Disease

## 2017-11-20 ENCOUNTER — Encounter: Payer: Self-pay | Admitting: General Surgery

## 2017-11-20 DIAGNOSIS — R0602 Shortness of breath: Secondary | ICD-10-CM

## 2017-11-20 MED ORDER — CLONIDINE HCL 0.1 MG PO TABS: 0.1000 mg | ORAL_TABLET | ORAL | 0 refills | Status: DC | PRN

## 2017-11-20 NOTE — Telephone Encounter (Signed)
Patient made aware to take the Clonidine as needed. She will keep track of her blood pressures and heart rate, plus when she takes the Clonidine this week and call back on Friday with these readings.

## 2017-11-21 ENCOUNTER — Other Ambulatory Visit: Payer: PPO

## 2017-11-24 ENCOUNTER — Telehealth: Payer: Self-pay | Admitting: Cardiovascular Disease

## 2017-11-24 DIAGNOSIS — I10 Essential (primary) hypertension: Secondary | ICD-10-CM

## 2017-11-24 MED ORDER — SPIRONOLACTONE 25 MG PO TABS: 25.0000 mg | ORAL_TABLET | Freq: Every day | ORAL | 6 refills | Status: DC

## 2017-11-24 NOTE — Telephone Encounter (Signed)
I spoke with the patient. She is aware of Dr. Tyrell Antonio recommendations to start spironolactone 25 mg once daily and is agreeable.  She is scheduled for an echo to be done next Thursday. I advised we can recheck her lab work at that time.

## 2017-11-24 NOTE — Telephone Encounter (Signed)
Add spironolactone 25 mg once daily.  Check basic metabolic profile in 1 week.

## 2017-11-24 NOTE — Telephone Encounter (Signed)
Pt calling stating she was told to call today with this weeks BP reading  11/20/17  9 am  Left arm 151/95 HR 59 Right arm 144/95  6:30 pm (took a clonidine)  Left arm 189/110 HR 57 Right arm 190/105  HR 56  11/21/17 9 am  Left arm 126/87 HR 67 Right arm 122/78 HR 62 5:30 pm (took a clonidine) Left arm 183/107 HR 59 195/107 HR 57   11/22/17  9 am  Left arm 159/94 HR 59 Right arm 150/89 HR 55 5:30 pm (took a clonidine) Left arm 181/103 HR 60 Right arm 182/106 HR 62   11/23/17  9 am  Left arm 150/91 HR 61 Right arm 148/91 HR 57 5:30 pm (took a clonidine) Left arm144/96 HR 66 Right arm 149/89 HR 63   11/24/17 9 am Left arm 142/90 HR 55 Right arm 139/84 HR 55   Would like a call back about BP readings

## 2017-11-24 NOTE — Telephone Encounter (Signed)
To Dr. Arida to review.   

## 2017-11-27 ENCOUNTER — Other Ambulatory Visit: Payer: PPO

## 2017-11-29 ENCOUNTER — Telehealth: Payer: Self-pay | Admitting: Cardiovascular Disease

## 2017-11-29 DIAGNOSIS — I10 Essential (primary) hypertension: Secondary | ICD-10-CM

## 2017-11-29 DIAGNOSIS — Z79899 Other long term (current) drug therapy: Secondary | ICD-10-CM

## 2017-11-29 NOTE — Telephone Encounter (Signed)
Patient missed appointment for Lab on 7/8 for BMP Would like to know if order can be arranged so she can go to the [facility] for lab before or after ECHO Please advise

## 2017-11-29 NOTE — Telephone Encounter (Signed)
Called patient. She wanted to see if she could get the lab work tomorrow along with the echo. Advised patient we did not have any staff available to do it in the office but that she could go to the [facility] before or after the echo for the labs. She was agreeable. BMET entered so can be done at [facility].

## 2017-11-30 ENCOUNTER — Other Ambulatory Visit (INDEPENDENT_AMBULATORY_CARE_PROVIDER_SITE_OTHER): Payer: PPO | Admitting: *Deleted

## 2017-11-30 ENCOUNTER — Other Ambulatory Visit: Payer: Self-pay

## 2017-11-30 ENCOUNTER — Encounter: Payer: Self-pay | Admitting: *Deleted

## 2017-11-30 ENCOUNTER — Ambulatory Visit (INDEPENDENT_AMBULATORY_CARE_PROVIDER_SITE_OTHER): Payer: PPO

## 2017-11-30 DIAGNOSIS — Z79899 Other long term (current) drug therapy: Secondary | ICD-10-CM

## 2017-11-30 DIAGNOSIS — R0602 Shortness of breath: Secondary | ICD-10-CM

## 2017-11-30 DIAGNOSIS — I10 Essential (primary) hypertension: Secondary | ICD-10-CM | POA: Diagnosis not present

## 2017-12-01 LAB — BASIC METABOLIC PANEL
BUN/Creatinine Ratio: 13 (ref 12–28)
BUN: 11 mg/dL (ref 8–27)
CO2: 22 mmol/L (ref 20–29)
CREATININE: 0.88 mg/dL (ref 0.57–1.00)
Calcium: 10 mg/dL (ref 8.7–10.3)
Chloride: 94 mmol/L — ABNORMAL LOW (ref 96–106)
GFR calc Af Amer: 74 mL/min/{1.73_m2} (ref >59)
GFR calc non Af Amer: 64 mL/min/{1.73_m2} (ref >59)
GLUCOSE: 114 mg/dL — AB (ref 65–99)
Potassium: 5 mmol/L (ref 3.5–5.2)
Sodium: 132 mmol/L — ABNORMAL LOW (ref 134–144)

## 2017-12-04 ENCOUNTER — Telehealth: Payer: Self-pay | Admitting: *Deleted

## 2017-12-04 MED ORDER — HYDROCHLOROTHIAZIDE 25 MG PO TABS
25.0000 mg | ORAL_TABLET | Freq: Every day | ORAL | Status: DC
Start: 2017-12-04 — End: 2017-12-27

## 2017-12-04 NOTE — Telephone Encounter (Signed)
-----   Message from Wellington Hampshire, MD sent at 12/01/2017  9:57 AM EDT ----- Stable labs on spironolactone but sodium is mildly decreased likely due to hydrochlorothiazide.  Recommend decreasing hydrochlorothiazide to 25 mg daily.

## 2017-12-04 NOTE — Telephone Encounter (Signed)
Patient made aware and verbalized her understanding. Hydrochlorothiazide has been decreased to 25 mg.

## 2017-12-05 ENCOUNTER — Ambulatory Visit (INDEPENDENT_AMBULATORY_CARE_PROVIDER_SITE_OTHER): Payer: PPO | Admitting: General Surgery

## 2017-12-05 ENCOUNTER — Encounter: Payer: Self-pay | Admitting: General Surgery

## 2017-12-05 VITALS — BP 134/70 | HR 64 | Resp 14 | Ht 67.0 in | Wt 160.0 lb

## 2017-12-05 DIAGNOSIS — T8543XA Leakage of breast prosthesis and implant, initial encounter: Secondary | ICD-10-CM | POA: Diagnosis not present

## 2017-12-05 NOTE — Progress Notes (Signed)
Patient ID: Cindy Macdonald, female   DOB: 1940-11-22, 77 y.o.   MRN: [id number]  Chief Complaint  Patient presents with  . Other    HPI Cindy Macdonald is a 77 y.o. female here today for a evaluation of a right ruptured implant. Patient had a mammogram at [facility] in [place] and  went to [facility] for mammogram on 11/15/2017. She states she has been having some pain in the superior half of the right breast prior to her initial mammogram in May 2019, but has been has been having more pain ever since the last mammogram.the patient's prior mammogram was in June 2015.    Tylenol makes the pain better. Patient has been having blood presser problem for the last two months.  Daughter, Cindy Macdonald is present at visit.   HPI  Past Medical History:  Diagnosis Date  . Barrett's esophagus   . Benign tumor of parathyroid gland   . Colon polyps   . COPD (chronic obstructive pulmonary disease) ([place])   . GERD (gastroesophageal reflux disease)   . Hx of breast implants, bilateral   . Hyperlipidemia   . Hypertension     Past Surgical History:  Procedure Laterality Date  . COLONOSCOPY  2015   Dr [name]  . HEMORRHOID SURGERY    . PARATHYROIDECTOMY    . PLACEMENT OF BREAST IMPLANTS     age 74  . UPPER GI ENDOSCOPY     dr Vira Agar    Family History  Problem Relation Age of Onset  . Lung cancer Father   . Brain cancer Brother   . Lung cancer Paternal Grandfather   . Lymphoma Sister   . Leukemia Sister   . Cancer Brother   . Lymphoma Brother     Social History Social History   Tobacco Use  . Smoking status: Never Smoker  . Smokeless tobacco: Never Used  Substance Use Topics  . Alcohol use: No  . Drug use: No    Allergies  Allergen Reactions  . Ace Inhibitors   . Codeine   . Other     Darvocet   . Sulfa Antibiotics     Current Outpatient Medications  Medication Sig Dispense Refill  . acetaminophen (TYLENOL) 325 MG tablet Take 650 mg by mouth every 6 (six) hours as needed.    Marland Kitchen  albuterol (PROVENTIL HFA;VENTOLIN HFA) 108 (90 Base) MCG/ACT inhaler Inhale into the lungs every 6 (six) hours as needed for wheezing or shortness of breath.    . carvedilol (COREG) 12.5 MG tablet Take 1 tablet (12.5 mg total) by mouth 2 (two) times daily. 180 tablet 3  . cloNIDine (CATAPRES) 0.1 MG tablet Take 1 tablet (0.1 mg total) by mouth as needed. 60 tablet 0  . gabapentin (NEURONTIN) 600 MG tablet Take 1,200 mg by mouth at bedtime.     . hydrochlorothiazide (HYDRODIURIL) 25 MG tablet Take 1 tablet (25 mg total) by mouth at bedtime.    . lansoprazole (PREVACID) 30 MG capsule Take 30 mg by mouth daily.  3  . losartan (COZAAR) 100 MG tablet Take 100 mg by mouth at bedtime.     Marland Kitchen MAGNESIUM PO Take 400 mg by mouth daily.    Marland Kitchen omega-3 acid ethyl esters (LOVAZA) 1 g capsule Take 360 mg by mouth daily.    . pravastatin (PRAVACHOL) 40 MG tablet Take 40 mg by mouth at bedtime.     Marland Kitchen spironolactone (ALDACTONE) 25 MG tablet Take 1 tablet (25 mg total) by mouth daily.  30 tablet 6  . vitamin C (ASCORBIC ACID) 500 MG tablet Take 500 mg by mouth daily.    . vitamin E 400 UNIT capsule Take 400 Units by mouth daily.     No current facility-administered medications for this visit.     Review of Systems Review of Systems  Constitutional: Negative.   Respiratory: Negative.   Cardiovascular: Negative.     Blood pressure 134/70, pulse 64, resp. rate 14, height 5\' 7"  (1.702 m), weight 160 lb (72.6 kg).  Physical Exam Physical Exam  Constitutional: She is oriented to person, place, and time. She appears well-developed and well-nourished.  Cardiovascular: Normal rate and regular rhythm.  Pulmonary/Chest: Effort normal and breath sounds normal. Right breast exhibits no inverted nipple, no mass, no nipple discharge, no skin change and no tenderness. Left breast exhibits no inverted nipple, no mass, no nipple discharge, no skin change and no tenderness.    Neurological: She is alert and oriented to  person, place, and time.  Skin: Skin is warm and dry.    Data Reviewed Bilateral mammograms and ultrasound of November 15, 2017 reviewed.  Significant capsular contraction of both prostheses.  Focal area of extravasated silicon in the 5 and 5:17 positions of the right breast.  Otherwise unremarkable.  Assessment    Minimal leakage on the right from prosthetic rupture.    Plan  The patient's area of discomfort is not not correlating with the area of silicone extravasation, and then based on today's benign exam capsule/implant removal is not indicated.  Patient to return as needed. The patient is aware to call back for any questions or concerns.    HPI, Physical Exam, Assessment and Plan have been scribed under the direction and in the presence of Hervey Ard, MD.  Gaspar Cola, CMA  I have completed the exam and reviewed the above documentation for accuracy and completeness.  I agree with the above.  Haematologist has been used and any errors in dictation or transcription are unintentional.  Hervey Ard, M.D., F.A.C.S.  Forest Gleason Analei Whinery 12/05/2017, 7:48 PM

## 2017-12-05 NOTE — Patient Instructions (Signed)
Patient to return as needed. The patient is aware to call back for any questions or concerns. 

## 2017-12-15 ENCOUNTER — Ambulatory Visit (INDEPENDENT_AMBULATORY_CARE_PROVIDER_SITE_OTHER): Payer: PPO | Admitting: Cardiovascular Disease

## 2017-12-15 ENCOUNTER — Encounter: Payer: Self-pay | Admitting: Cardiovascular Disease

## 2017-12-15 VITALS — BP 150/82 | HR 64 | Ht 64.0 in | Wt 160.0 lb

## 2017-12-15 DIAGNOSIS — R0600 Dyspnea, unspecified: Secondary | ICD-10-CM | POA: Diagnosis not present

## 2017-12-15 DIAGNOSIS — E785 Hyperlipidemia, unspecified: Secondary | ICD-10-CM

## 2017-12-15 DIAGNOSIS — I10 Essential (primary) hypertension: Secondary | ICD-10-CM

## 2017-12-15 NOTE — Patient Instructions (Signed)
Medication Instructions: Continue same medications.   Labwork: None.   Procedures/Testing: None.   Follow-Up: 3 months with Dr. Arida.   Any Additional Special Instructions Will Be Listed Below (If Applicable).     If you need a refill on your cardiac medications before your next appointment, please call your pharmacy.   

## 2017-12-15 NOTE — Progress Notes (Signed)
Cardiology Office Note   Date:  12/15/2017   ID:  Cindy Macdonald, DOB Mar 24, 1941, MRN [id number]  PCP:  Cyndi Bender, PA-C  Cardiologist:   Kathlyn Sacramento, MD   Chief Complaint  Patient presents with  . other    1 month follow up. Patient denies chest pain and SOB. patient states she feels so much better than a month ago.  Meds reviewed verbally with patient.       History of Present Illness: Cindy Macdonald is a 77 y.o. female who is here today for follow-up visit regarding refractory hypertension.   She has known history of hypertension diagnosed in her early 69s, hyperlipidemia and previous tobacco use.  There is strong family history of hypertension.  She was seen recently for difficult to control hypertension with negative work-up for secondary hypertension.   I switch metoprolol to carvedilol.  Blood pressure continued to be elevated and thus I added spironolactone.  Subsequent basic metabolic profile showed a potassium of 5 and mild hyponatremia at 132.  Thus, I decreased hydrochlorothiazide to 12.5 mg once daily.  She had subsequent low blood pressure readings especially after taking the morning dose of carvedilol and due to that she stopped taking spironolactone.  She has been doing well on current regimen and most home blood pressure readings are in the normal range.  She feels significantly better.  She has not required clonidine.   Past Medical History:  Diagnosis Date  . Barrett's esophagus   . Benign tumor of parathyroid gland   . Colon polyps   . COPD (chronic obstructive pulmonary disease) ([place])   . GERD (gastroesophageal reflux disease)   . Hx of breast implants, bilateral   . Hyperlipidemia   . Hypertension     Past Surgical History:  Procedure Laterality Date  . COLONOSCOPY  2015   Dr [name]  . HEMORRHOID SURGERY    . PARATHYROIDECTOMY    . PLACEMENT OF BREAST IMPLANTS     age 62  . UPPER GI ENDOSCOPY     dr Vira Agar     Current Outpatient  Medications  Medication Sig Dispense Refill  . acetaminophen (TYLENOL) 325 MG tablet Take 650 mg by mouth every 6 (six) hours as needed.    Marland Kitchen albuterol (PROVENTIL HFA;VENTOLIN HFA) 108 (90 Base) MCG/ACT inhaler Inhale into the lungs every 6 (six) hours as needed for wheezing or shortness of breath.    . carvedilol (COREG) 12.5 MG tablet Take 1 tablet (12.5 mg total) by mouth 2 (two) times daily. 180 tablet 3  . cloNIDine (CATAPRES) 0.1 MG tablet Take 1 tablet (0.1 mg total) by mouth as needed. 60 tablet 0  . gabapentin (NEURONTIN) 600 MG tablet Take 1,200 mg by mouth at bedtime.     . hydrochlorothiazide (HYDRODIURIL) 25 MG tablet Take 1 tablet (25 mg total) by mouth at bedtime.    . lansoprazole (PREVACID) 30 MG capsule Take 30 mg by mouth daily.  3  . losartan (COZAAR) 100 MG tablet Take 100 mg by mouth at bedtime.     Marland Kitchen MAGNESIUM PO Take 400 mg by mouth daily.    Marland Kitchen omega-3 acid ethyl esters (LOVAZA) 1 g capsule Take 360 mg by mouth daily.    . pravastatin (PRAVACHOL) 40 MG tablet Take 40 mg by mouth at bedtime.     Marland Kitchen spironolactone (ALDACTONE) 25 MG tablet Take 1 tablet (25 mg total) by mouth daily. 30 tablet 6  . vitamin C (ASCORBIC ACID) 500  MG tablet Take 500 mg by mouth daily.    . vitamin E 400 UNIT capsule Take 400 Units by mouth daily.     No current facility-administered medications for this visit.     Allergies:   Ace inhibitors; Codeine; Other; and Sulfa antibiotics    Social History:  The patient  reports that she has never smoked. She has never used smokeless tobacco. She reports that she does not drink alcohol or use drugs.   Family History:  The patient's family history includes Brain cancer in her brother; Cancer in her brother; Leukemia in her sister; Lung cancer in her father and paternal grandfather; Lymphoma in her brother and sister.    ROS:  Please see the history of present illness.   Otherwise, review of systems are positive for none.   All other systems are  reviewed and negative.    PHYSICAL EXAM: VS:  BP (!) 150/82 (BP Location: Left Arm, Patient Position: Sitting, Cuff Size: Normal)   Pulse 64   Ht 5\' 4"  (1.626 m)   Wt 160 lb (72.6 kg)   SpO2 99%   BMI 27.46 kg/m  , BMI Body mass index is 27.46 kg/m. GEN: Well nourished, well developed, in no acute distress  HEENT: normal  Neck: no JVD, carotid bruits, or masses Cardiac: RRR; no  rubs, or gallops,no edema .  1 out of 6 systolic murmur in the aortic area Respiratory:  clear to auscultation bilaterally, normal work of breathing GI: soft, nontender, nondistended, + BS MS: no deformity or atrophy  Skin: warm and dry, no rash Neuro:  Strength and sensation are intact Psych: euthymic mood, full affect   EKG:  EKG is not ordered today.   Recent Labs: 09/27/2017: ALT 19; Hemoglobin 14.3; Platelets 294 11/30/2017: BUN 11; Creatinine, Ser 0.88; Potassium 5.0; Sodium 132    Lipid Panel No results found for: CHOL, TRIG, HDL, CHOLHDL, VLDL, LDLCALC, LDLDIRECT    Wt Readings from Last 3 Encounters:  12/15/17 160 lb (72.6 kg)  12/05/17 160 lb (72.6 kg)  11/17/17 164 lb 4 oz (74.5 kg)        PAD Screen 11/17/2017  Previous PAD dx? No  Previous surgical procedure? No  Pain with walking? No  Feet/toe relief with dangling? No  Painful, non-healing ulcers? No  Extremities discolored? No      ASSESSMENT AND PLAN:  1.  Refractory hypertension: No evidence of secondary hypertension based on testing.   Blood pressure improved significantly on current regimen of carvedilol 12.5 mg twice daily, hydrochlorothiazide 12.5 mg daily and losartan 100 mg once daily.  Her blood pressure drops after she takes the morning dose of carvedilol and I advised her to move the morning dose towards lunchtime.  Also heart rate at home has been running in the high 50s.  If blood pressure starts going up again, I will consider decreasing carvedilol to 6.25 mg twice daily and resuming spironolactone 25 mg  daily.  2.  Dyspnea: Was due to uncontrolled hypertension.  She reports resolution of symptoms.  Recent echocardiogram showed normal LV systolic function and no significant valvular abnormalities.  3. Hyperlipidemia: Currently on pravastatin.   Disposition:   FU with me in 3 month  Signed,  Kathlyn Sacramento, MD  12/15/2017 3:01 PM    [place]

## 2017-12-20 DIAGNOSIS — R7303 Prediabetes: Secondary | ICD-10-CM | POA: Diagnosis not present

## 2017-12-20 DIAGNOSIS — E782 Mixed hyperlipidemia: Secondary | ICD-10-CM | POA: Diagnosis not present

## 2017-12-20 DIAGNOSIS — I10 Essential (primary) hypertension: Secondary | ICD-10-CM | POA: Diagnosis not present

## 2017-12-20 DIAGNOSIS — Z1339 Encounter for screening examination for other mental health and behavioral disorders: Secondary | ICD-10-CM | POA: Diagnosis not present

## 2017-12-20 DIAGNOSIS — K2270 Barrett's esophagus without dysplasia: Secondary | ICD-10-CM | POA: Diagnosis not present

## 2017-12-20 DIAGNOSIS — B0229 Other postherpetic nervous system involvement: Secondary | ICD-10-CM | POA: Diagnosis not present

## 2017-12-20 DIAGNOSIS — Z6827 Body mass index (BMI) 27.0-27.9, adult: Secondary | ICD-10-CM | POA: Diagnosis not present

## 2017-12-26 ENCOUNTER — Telehealth: Payer: Self-pay | Admitting: Cardiovascular Disease

## 2017-12-26 DIAGNOSIS — Z79899 Other long term (current) drug therapy: Secondary | ICD-10-CM

## 2017-12-26 DIAGNOSIS — I10 Essential (primary) hypertension: Secondary | ICD-10-CM

## 2017-12-26 NOTE — Telephone Encounter (Signed)
Pt c/o BP issue: STAT if pt c/o blurred vision, one-sided weakness or slurred speech  1. What are your last 5 BP readings?   12/26/17 125/83 12/25/17 850 am 153/19             6 pm    175/103 12/24/17  830 am 146/96              715pm  168/100 12/23/17 7 pm 158/98 2. Are you having any other symptoms (ex. Dizziness, headache, blurred vision, passed out)?  She states she just feels tired  3. What is your BP issue?  BP is higher than normal.  Would like advise on how to keep it normal. She states since she was seen last it was okay and good for about two weeks and then over time it climbed back up   Would like advise

## 2017-12-26 NOTE — Telephone Encounter (Signed)
She takes 12.5 mg daily of the hydrochlorothiazide.

## 2017-12-26 NOTE — Telephone Encounter (Signed)
Is she taking hydrochlorothiazide or not?

## 2017-12-26 NOTE — Telephone Encounter (Signed)
Call placed to the patient. She stated that her blood pressures have been running high.  12/26/17 125/83  HR 59  12/25/17 850 am 153/91 HR 57             6 pm    175/103  12/24/17  830 am 146/96  HR 56             715pm  168/100 HR 64  12/23/17 7 pm 158/98 HR 58  She currently takes: Carvedilol 12.5 mg bid at lunch and dinner  Clonidine 0.1 mg as needed (diastolic over 741) Losartan 100 mg at bedtime  She took her Clonidine last night for a diastolic over 287. She stated that this drops her blood pressure too low to take on a regular basis. She did not remember what the reading was after she took the clonidine last night. She has complaints of being fatigued throughout the day but denies any other symptoms. She stated that nothing has changed in her routine.

## 2017-12-27 MED ORDER — SPIRONOLACTONE 25 MG PO TABS: 25.0000 mg | ORAL_TABLET | Freq: Every day | ORAL | 3 refills | Status: DC

## 2017-12-27 NOTE — Telephone Encounter (Signed)
Patient made aware of stopping the HCTZ and starting spironolactone 25 mg daily. BMET in one week.   She will call back with any further needs.

## 2017-12-27 NOTE — Telephone Encounter (Signed)
Stop HCTZ and start Spironolactone 25 mg daily. Check BMP in 1 week.

## 2018-01-03 NOTE — Telephone Encounter (Signed)
Patient made aware and will have labs drawn tomorrow.

## 2018-01-03 NOTE — Telephone Encounter (Signed)
Basic metabolic profile has to be done within 1 week after starting spironolactone due to risk of hyperkalemia.  The end of August is too far out.

## 2018-01-03 NOTE — Telephone Encounter (Signed)
Patient called and reminded to get her repeat labs done. She stated that she will be having labs done at her PCP on 8/30 due to a previous low WBC and low sodium count. Those labs were drawn on 7/31. Sodium on 7/31 was 131.  Labs have been faxed to the office and placed in the provider's basket for his knowledge.   The patient would like to know if she still needs to get the BMET here or if she can wait to have this done at her PCP on 8/30.

## 2018-01-04 ENCOUNTER — Other Ambulatory Visit
Admission: RE | Admit: 2018-01-04 | Discharge: 2018-01-04 | Disposition: A | Discharge status: Home / Self Care | Payer: PPO | Source: Ambulatory Visit | Attending: Cardiovascular Disease | Admitting: Cardiovascular Disease

## 2018-01-04 DIAGNOSIS — I10 Essential (primary) hypertension: Secondary | ICD-10-CM | POA: Insufficient documentation

## 2018-01-04 DIAGNOSIS — Z79899 Other long term (current) drug therapy: Secondary | ICD-10-CM

## 2018-01-04 LAB — BASIC METABOLIC PANEL
Anion gap: 8 (ref 5–15)
BUN: 12 mg/dL (ref 8–23)
CHLORIDE: 103 mmol/L (ref 98–111)
CO2: 23 mmol/L (ref 22–32)
Calcium: 9.4 mg/dL (ref 8.9–10.3)
Creatinine, Ser: 0.89 mg/dL (ref 0.44–1.00)
GFR calc non Af Amer: >60 mL/min (ref >60)
Glucose, Bld: 111 mg/dL — ABNORMAL HIGH (ref 70–99)
Potassium: 4.3 mmol/L (ref 3.5–5.1)
Sodium: 134 mmol/L — ABNORMAL LOW (ref 135–145)

## 2018-01-04 NOTE — Telephone Encounter (Signed)
Left a message to call back.

## 2018-01-04 NOTE — Telephone Encounter (Signed)
Patient is coming to [facility] today for labs Patient would like to know if she can have a prescription sent in for an inhaler (unsure of name) If a prescription needs to be picked up she can come by around noon Please call to discuss

## 2018-01-04 NOTE — Telephone Encounter (Signed)
Patient made aware that she will need to call the primary prescriber of the inhaler for a refill. She has verbalized her understanding.

## 2018-01-08 ENCOUNTER — Telehealth: Payer: Self-pay | Admitting: Cardiovascular Disease

## 2018-01-08 MED ORDER — AMLODIPINE BESYLATE 5 MG PO TABS: 5.0000 mg | ORAL_TABLET | Freq: Every day | ORAL | 3 refills | Status: DC

## 2018-01-08 NOTE — Telephone Encounter (Signed)
S/w patient. She verbalized understanding to start amlodipine 5 mg daily and that she could take it in the morning. Rx sent to pharmacy.

## 2018-01-08 NOTE — Telephone Encounter (Signed)
Patient says BP running high. She is especially concerned about the afternoon.  8/15- 8:50 142/85 HR 62 , 5:00 pm 150/88  8/16-8:50 149/90, 6 pm 154/96 8/17-139/88 am, 6:20 pm 160/99 8/18-148/94am , 6:30 167/97 8/19- 160/96 am HR 67  The morning reading is prior to morning carvedilol. Evening readings are about 2 hours after the spironolactone. Takes spironolactone at 4pm each day. Take losartan and evening dose of carvedilol at 9pm.   Patient denies headache or blurred vision. Patient takes clonidine if DBP >100 as she has been advised in the past. Advised patient to take BP/HR about 2 hours after taking morning and evening medications and record them and call us back if running > 140/90. She is still concerned that it is as high as it is before her medications. Advised I will route to Dr Fletcher Anon for review.

## 2018-01-08 NOTE — Telephone Encounter (Signed)
Pt states her BP has been going up and down.  8/15- 8:50 142/85 HR 62 , 5:00 pm 150/88  8/16-8:50 149/90, 6 pm 154/96 8/17-139/88 am, 6:20 pm 160/99 8/18-148/94am , 6:30 167/97 8/19- 160/96 am  Pt states she feels tired.  Please call to discuss

## 2018-01-08 NOTE — Telephone Encounter (Signed)
Add amlodipine 5 mg once daily.  Continue other medications.

## 2018-01-09 NOTE — Telephone Encounter (Signed)
Call placed to the patient. She stated that her blood pressure is still elevated. She started the Amlodipine 5 mg last night and then took it again this morning.   She took her Amlodipine 5 mg and Carvedilol 12.5 mg this morning at 9 and then at 11 am her blood pressure was 113/79 and heart rate was 71.  Her blood pressure this afternoon was 163/102. She took a Clonidine 0.1 mg and an hour later her blood pressure was 170/99 and heart rate was 62. She had not taken her spironolactone as of yet. She stated that since she has started spironolactone her blood pressure has not gone down. Her only symptom is extreme tiredness. She maintains a low sodium heart healthy diet.   Morning Amlodipine 5 mg Carvedilol 12.5 mg  Afternoon  Spironolactone 25 mg  Evening Losartan 100 mg Carvedilol 12.5 mg  Dr. Fletcher Anon has advised that the patient give it a few more days to let the Amlodipine work and to only check her blood pressure once daily. The patient has verbalized her understanding.

## 2018-01-09 NOTE — Telephone Encounter (Signed)
Patient calling sounds very worried.  She states she is worried for her BP is still high She took the Amlodipine along with regular medications this morning but just took her BP   Left arm 163/102 Right arm 172/102   She is worried for this is not normal for her   Would like a call back to help bring this down

## 2018-01-14 ENCOUNTER — Emergency Department (HOSPITAL_COMMUNITY): Payer: PPO

## 2018-01-14 ENCOUNTER — Emergency Department (HOSPITAL_COMMUNITY)
Admission: EM | Admit: 2018-01-14 | Discharge: 2018-01-14 | Disposition: A | Discharge status: Home / Self Care | Payer: PPO | Attending: Emergency Medicine | Admitting: Emergency Medicine

## 2018-01-14 ENCOUNTER — Encounter (HOSPITAL_COMMUNITY): Payer: Self-pay

## 2018-01-14 DIAGNOSIS — R0602 Shortness of breath: Secondary | ICD-10-CM

## 2018-01-14 DIAGNOSIS — R531 Weakness: Secondary | ICD-10-CM | POA: Insufficient documentation

## 2018-01-14 DIAGNOSIS — J449 Chronic obstructive pulmonary disease, unspecified: Secondary | ICD-10-CM | POA: Diagnosis not present

## 2018-01-14 DIAGNOSIS — R110 Nausea: Secondary | ICD-10-CM | POA: Diagnosis not present

## 2018-01-14 DIAGNOSIS — I10 Essential (primary) hypertension: Secondary | ICD-10-CM | POA: Diagnosis not present

## 2018-01-14 DIAGNOSIS — Z79899 Other long term (current) drug therapy: Secondary | ICD-10-CM | POA: Diagnosis not present

## 2018-01-14 DIAGNOSIS — R4020 Unspecified coma: Secondary | ICD-10-CM | POA: Diagnosis not present

## 2018-01-14 LAB — CBC
HCT: 41.6 % (ref 36.0–46.0)
HEMOGLOBIN: 13.8 g/dL (ref 12.0–15.0)
MCH: 28.5 pg (ref 26.0–34.0)
MCHC: 33.2 g/dL (ref 30.0–36.0)
MCV: 86 fL (ref 78.0–100.0)
Platelets: 248 10*3/uL (ref 150–400)
RBC: 4.84 MIL/uL (ref 3.87–5.11)
RDW: 18.1 % — ABNORMAL HIGH (ref 11.5–15.5)
WBC: 3.6 10*3/uL — ABNORMAL LOW (ref 4.0–10.5)

## 2018-01-14 LAB — RAPID URINE DRUG SCREEN, HOSP PERFORMED
AMPHETAMINES: NOT DETECTED
BARBITURATES: NOT DETECTED
BENZODIAZEPINES: NOT DETECTED
COCAINE: NOT DETECTED
Opiates: NOT DETECTED
TETRAHYDROCANNABINOL: NOT DETECTED

## 2018-01-14 LAB — BASIC METABOLIC PANEL
Anion gap: 13 (ref 5–15)
BUN: 11 mg/dL (ref 8–23)
CO2: 20 mmol/L — AB (ref 22–32)
Calcium: 9.6 mg/dL (ref 8.9–10.3)
Chloride: 101 mmol/L (ref 98–111)
Creatinine, Ser: 0.73 mg/dL (ref 0.44–1.00)
GFR calc Af Amer: >60 mL/min (ref >60)
GLUCOSE: 103 mg/dL — AB (ref 70–99)
POTASSIUM: 3.6 mmol/L (ref 3.5–5.1)
Sodium: 134 mmol/L — ABNORMAL LOW (ref 135–145)

## 2018-01-14 LAB — URINALYSIS, ROUTINE W REFLEX MICROSCOPIC
BILIRUBIN URINE: NEGATIVE
GLUCOSE, UA: NEGATIVE mg/dL
HGB URINE DIPSTICK: NEGATIVE
KETONES UR: 5 mg/dL — AB
Leukocytes, UA: NEGATIVE
Nitrite: NEGATIVE
Protein, ur: NEGATIVE mg/dL
Specific Gravity, Urine: 1.005 (ref 1.005–1.030)
pH: 7 (ref 5.0–8.0)

## 2018-01-14 LAB — ETHANOL: Alcohol, Ethyl (B): <10 mg/dL (ref <10)

## 2018-01-14 LAB — BRAIN NATRIURETIC PEPTIDE: B Natriuretic Peptide: 46.2 pg/mL (ref 0.0–100.0)

## 2018-01-14 LAB — AMMONIA: Ammonia: 10 umol/L (ref 9–35)

## 2018-01-14 LAB — TROPONIN I: Troponin I: <0.03 ng/mL (ref <0.03)

## 2018-01-14 NOTE — ED Notes (Signed)
Patient transported to X-ray 

## 2018-01-14 NOTE — ED Notes (Signed)
Pt reports feeling short of breath when taking orthostatic vital signs.

## 2018-01-14 NOTE — ED Provider Notes (Signed)
[facility] [facility] EMERGENCY DEPARTMENT Provider Note   CSN: [id number] Arrival date & time: 01/14/18  1257     History   Chief Complaint Chief Complaint  Patient presents with  . Weakness  . Shortness of Breath    HPI Cindy Macdonald is a 77 y.o. female.  77 year old female presents with use weakness which began today.  Has had this before in the past.  Patient has had issues with having hypertension which is been difficult to control for several months.  Today which went to church she said she felt that her head was fuzzy like she might pass out.  No reported syncope.  No chest pain or chest discomfort.  She has not been short of breath.  No recent history of blood loss.  He has had this before in the past without a diagnosis.     Past Medical History:  Diagnosis Date  . Barrett's esophagus   . Benign tumor of parathyroid gland   . Colon polyps   . COPD (chronic obstructive pulmonary disease) ([place])   . GERD (gastroesophageal reflux disease)   . Hx of breast implants, bilateral   . Hyperlipidemia   . Hypertension     Patient Active Problem List   Diagnosis Date Noted  . Ruptured right breast implant 12/05/2017    Past Surgical History:  Procedure Laterality Date  . COLONOSCOPY  2015   Dr [name]  . HEMORRHOID SURGERY    . PARATHYROIDECTOMY    . PLACEMENT OF BREAST IMPLANTS     age 49  . UPPER GI ENDOSCOPY     dr Vira Agar     OB History    Gravida      Para      Term      Preterm      AB      Living  3     SAB      TAB      Ectopic      Multiple      Live Births           Obstetric Comments  Menstrual age: 31  Age 1st Pregnancy: 81          Home Medications    Prior to Admission medications   Medication Sig Start Date End Date Taking? Authorizing Provider  acetaminophen (TYLENOL) 325 MG tablet Take 650 mg by mouth every 6 (six) hours as needed.    [provider]  albuterol (PROVENTIL HFA;VENTOLIN HFA) 108  (90 Base) MCG/ACT inhaler Inhale into the lungs every 6 (six) hours as needed for wheezing or shortness of breath.    [provider]  amLODipine (NORVASC) 5 MG tablet Take 1 tablet (5 mg total) by mouth daily. 01/08/18 04/08/18  Wellington Hampshire, MD  carvedilol (COREG) 12.5 MG tablet Take 1 tablet (12.5 mg total) by mouth 2 (two) times daily. 11/17/17 02/15/18  Wellington Hampshire, MD  cloNIDine (CATAPRES) 0.1 MG tablet Take 1 tablet (0.1 mg total) by mouth as needed. 11/20/17   Wellington Hampshire, MD  gabapentin (NEURONTIN) 600 MG tablet Take 1,200 mg by mouth at bedtime.     [provider]  lansoprazole (PREVACID) 30 MG capsule Take 30 mg by mouth daily. 10/23/17   [provider]  losartan (COZAAR) 100 MG tablet Take 100 mg by mouth at bedtime.     [provider]  MAGNESIUM PO Take 400 mg by mouth daily.    [provider]  omega-3 acid ethyl esters (LOVAZA) 1 g capsule Take 360 mg by mouth daily.    [provider]  pravastatin (PRAVACHOL) 40 MG tablet Take 40 mg by mouth at bedtime.     [provider]  spironolactone (ALDACTONE) 25 MG tablet Take 1 tablet (25 mg total) by mouth daily. 12/27/17 03/27/18  Wellington Hampshire, MD  vitamin C (ASCORBIC ACID) 500 MG tablet Take 500 mg by mouth daily.    [provider]  vitamin E 400 UNIT capsule Take 400 Units by mouth daily.    [provider]    Family History Family History  Problem Relation Age of Onset  . Lung cancer Father   . Brain cancer Brother   . Lung cancer Paternal Grandfather   . Lymphoma Sister   . Leukemia Sister   . Cancer Brother   . Lymphoma Brother     Social History Social History   Tobacco Use  . Smoking status: Never Smoker  . Smokeless tobacco: Never Used  Substance Use Topics  . Alcohol use: No  . Drug use: No     Allergies   Ace inhibitors; Codeine; Other; and Sulfa antibiotics   Review of Systems Review of Systems  All other  systems reviewed and are negative.    Physical Exam Updated Vital Signs BP (!) 154/90 (BP Location: Left Arm)   Pulse 68   Temp 98.1 F (36.7 C) (Oral)   Resp 16   SpO2 100%   Physical Exam  Constitutional: She is oriented to person, place, and time. She appears well-developed and well-nourished.  Non-toxic appearance. No distress.  HENT:  Head: Normocephalic and atraumatic.  Eyes: Pupils are equal, round, and reactive to light. Conjunctivae, EOM and lids are normal.  Neck: Normal range of motion. Neck supple. No tracheal deviation present. No thyroid mass present.  Cardiovascular: Normal rate, regular rhythm and normal heart sounds. Exam reveals no gallop.  No murmur heard. Pulmonary/Chest: Effort normal and breath sounds normal. No stridor. No respiratory distress. She has no decreased breath sounds. She has no wheezes. She has no rhonchi. She has no rales.  Abdominal: Soft. Normal appearance and bowel sounds are normal. She exhibits no distension. There is no tenderness. There is no rebound and no CVA tenderness.  Musculoskeletal: Normal range of motion. She exhibits no edema or tenderness.  Neurological: She is alert and oriented to person, place, and time. She has normal strength. No cranial nerve deficit or sensory deficit. GCS eye subscore is 4. GCS verbal subscore is 5. GCS motor subscore is 6.  Skin: Skin is warm and dry. No abrasion and no rash noted.  Psychiatric: Her affect is blunt. Her speech is delayed. She is withdrawn. She is inattentive.  Nursing note and vitals reviewed.    ED Treatments / Results  Labs (all labs ordered are listed, but only abnormal results are displayed) Labs Reviewed  BASIC METABOLIC PANEL  CBC    EKG EKG Interpretation  Date/Time:  Sunday January 14 2018 13:01:20 EDT Ventricular Rate:  66 PR Interval:  154 QRS Duration: 88 QT Interval:  410 QTC Calculation: 429 R Axis:   4 Text Interpretation:  Normal sinus rhythm Possible Left  atrial enlargement Anterior infarct , age undetermined Abnormal ECG No significant change since last tracing Confirmed by Lacretia Leigh ([id number]) on 01/14/2018 1:29:52 PM   Radiology No results found.  Procedures Procedures (including critical care time)  Medications Ordered in ED Medications - No data to display  Initial Impression / Assessment and Plan / ED Course  I have reviewed the triage vital signs and the nursing notes.  Pertinent labs & imaging results that were available during my care of the patient were reviewed by me and considered in my medical decision making (see chart for details).     Patient's evaluation here is reassuring.  Patient's repeat neurological assessment is stable.  Suspect that her blood pressure may be the culprit in her current symptoms.  Instructed to follow-up with her doctor tomorrow  Final Clinical Impressions(s) / ED Diagnoses   Final diagnoses:  SOB (shortness of breath)    ED Discharge Orders    None       Lacretia Leigh, MD 01/14/18 1640

## 2018-01-16 ENCOUNTER — Ambulatory Visit: Payer: PPO | Admitting: Cardiovascular Disease

## 2018-01-17 DIAGNOSIS — R7309 Other abnormal glucose: Secondary | ICD-10-CM | POA: Diagnosis not present

## 2018-01-17 DIAGNOSIS — M542 Cervicalgia: Secondary | ICD-10-CM | POA: Diagnosis not present

## 2018-01-17 DIAGNOSIS — I10 Essential (primary) hypertension: Secondary | ICD-10-CM | POA: Diagnosis not present

## 2018-01-17 DIAGNOSIS — R531 Weakness: Secondary | ICD-10-CM | POA: Diagnosis not present

## 2018-01-17 DIAGNOSIS — F419 Anxiety disorder, unspecified: Secondary | ICD-10-CM | POA: Diagnosis not present

## 2018-01-31 DIAGNOSIS — I10 Essential (primary) hypertension: Secondary | ICD-10-CM | POA: Diagnosis not present

## 2018-01-31 DIAGNOSIS — Z6826 Body mass index (BMI) 26.0-26.9, adult: Secondary | ICD-10-CM | POA: Diagnosis not present

## 2018-01-31 DIAGNOSIS — F419 Anxiety disorder, unspecified: Secondary | ICD-10-CM | POA: Diagnosis not present

## 2018-01-31 DIAGNOSIS — M542 Cervicalgia: Secondary | ICD-10-CM | POA: Diagnosis not present

## 2018-01-31 DIAGNOSIS — N644 Mastodynia: Secondary | ICD-10-CM | POA: Diagnosis not present

## 2018-02-06 ENCOUNTER — Other Ambulatory Visit: Payer: Self-pay

## 2018-02-06 ENCOUNTER — Ambulatory Visit (INDEPENDENT_AMBULATORY_CARE_PROVIDER_SITE_OTHER): Payer: PPO | Admitting: General Surgery

## 2018-02-06 ENCOUNTER — Encounter: Payer: Self-pay | Admitting: General Surgery

## 2018-02-06 VITALS — BP 138/82 | HR 77 | Temp 92.1°F | Resp 12 | Ht 64.0 in | Wt 151.8 lb

## 2018-02-06 DIAGNOSIS — T8543XA Leakage of breast prosthesis and implant, initial encounter: Secondary | ICD-10-CM | POA: Diagnosis not present

## 2018-02-06 NOTE — Progress Notes (Signed)
Patient ID: Cindy Macdonald, female   DOB: 10/20/1940, 77 y.o.   MRN: [id number]  Chief Complaint  Patient presents with  . Follow-up     f/u right breast ruputred implants- in pain    HPI Cindy Macdonald is a 77 y.o. female here today to follow up for right breast ruputred implants pain. Patient states the pain comes and goes, her daughter Cindy Macdonald who is with her states her mother is in pain all the time.  HPI  Past Medical History:  Diagnosis Date  . Barrett's esophagus   . Benign tumor of parathyroid gland   . Colon polyps   . COPD (chronic obstructive pulmonary disease) ([place])   . GERD (gastroesophageal reflux disease)   . Hx of breast implants, bilateral   . Hyperlipidemia   . Hypertension     Past Surgical History:  Procedure Laterality Date  . COLONOSCOPY  2015   Dr [name]  . HEMORRHOID SURGERY    . PARATHYROIDECTOMY    . PLACEMENT OF BREAST IMPLANTS     age 65  . UPPER GI ENDOSCOPY     dr Vira Agar    Family History  Problem Relation Age of Onset  . Lung cancer Father   . Brain cancer Brother   . Lung cancer Paternal Grandfather   . Lymphoma Sister   . Leukemia Sister   . Cancer Brother   . Lymphoma Brother     Social History Social History   Tobacco Use  . Smoking status: Never Smoker  . Smokeless tobacco: Never Used  Substance Use Topics  . Alcohol use: No  . Drug use: No    Allergies  Allergen Reactions  . Codeine Nausea And Vomiting  . Propoxyphene Nausea And Vomiting    Reaction to Darvocet  . Ace Inhibitors Cough  . Sulfa Antibiotics Other (See Comments)    Unknown reaction    Current Outpatient Medications  Medication Sig Dispense Refill  . acetaminophen (TYLENOL) 500 MG tablet Take 1,000 mg by mouth every 6 (six) hours as needed for headache (pain).     Marland Kitchen albuterol (PROVENTIL HFA;VENTOLIN HFA) 108 (90 Base) MCG/ACT inhaler Inhale 2 puffs into the lungs every 6 (six) hours as needed for wheezing or shortness of breath.     Marland Kitchen amLODipine  (NORVASC) 5 MG tablet Take 1 tablet (5 mg total) by mouth daily. 90 tablet 3  . carvedilol (COREG) 12.5 MG tablet Take 1 tablet (12.5 mg total) by mouth 2 (two) times daily. 180 tablet 3  . cloNIDine (CATAPRES) 0.1 MG tablet Take 1 tablet (0.1 mg total) by mouth as needed. (Patient taking differently: Take 0.1 mg by mouth 2 (two) times daily as needed (BP >170/100). ) 60 tablet 0  . Flaxseed, Linseed, (FLAXSEED OIL) 1000 MG CAPS Take 1,000 mg by mouth at bedtime.    . gabapentin (NEURONTIN) 600 MG tablet Take 1,200 mg by mouth at bedtime.     . lansoprazole (PREVACID) 30 MG capsule Take 30 mg by mouth daily.  3  . losartan (COZAAR) 100 MG tablet Take 100 mg by mouth at bedtime.     . Magnesium Oxide 250 MG TABS Take 500 mg by mouth at bedtime.    . Omega-3 Fatty Acids (FISH OIL) 1200 MG CAPS Take 2,400 mg by mouth at bedtime.    . pravastatin (PRAVACHOL) 40 MG tablet Take 40 mg by mouth at bedtime.     Marland Kitchen spironolactone (ALDACTONE) 25 MG tablet Take 1 tablet (25  mg total) by mouth daily. (Patient taking differently: Take 25 mg by mouth every evening. ) 90 tablet 3  . vitamin C (ASCORBIC ACID) 500 MG tablet Take 500 mg by mouth at bedtime.     . vitamin E 400 UNIT capsule Take 400 Units by mouth at bedtime.      No current facility-administered medications for this visit.     Review of Systems Review of Systems  Constitutional: Negative.   Respiratory: Negative.   Cardiovascular: Negative.     Blood pressure 138/82, pulse 77, temperature (!) 92.1 F (33.4 C), temperature source Temporal, resp. rate 12, height 5\' 4"  (1.626 m), weight 151 lb 12.8 oz (68.9 kg), SpO2 97 %.  Physical Exam Physical Exam  Constitutional: She is oriented to person, place, and time. She appears well-developed and well-nourished.  Eyes: Conjunctivae are normal. No scleral icterus.  Neck: Normal range of motion.  Cardiovascular: Normal rate, regular rhythm and normal heart sounds.  Pulmonary/Chest: Effort normal  and breath sounds normal. Right breast exhibits tenderness. Right breast exhibits no inverted nipple, no mass, no nipple discharge and no skin change. Left breast exhibits no inverted nipple, no mass, no nipple discharge, no skin change and no tenderness. Breasts are symmetrical.  No tenderness on clinical exam.  No erythema, induration or thickening.    Lymphadenopathy:    She has no cervical adenopathy.    She has no axillary adenopathy.       Right: No supraclavicular adenopathy present.       Left: No supraclavicular adenopathy present.  Neurological: She is alert and oriented to person, place, and time.  Skin: Skin is warm and dry.    Data Reviewed CBC dated January 14, 2018 when the patient presented to the emergency room with weakness and shortness of breath showed a hemoglobin of 13.8 with an MCV of 86, white blood cell count of 3600.  Increased RDW.  Normal platelets of 248.  0.5 g fall in hemoglobin over the preceding 4 months. Basic metabolic panel of the same date showed very mild hyponatremia.  Normal renal function.  December 15, 2017 cardiology evaluation with Midge Minium, MD reviewed.  Medication review today confirmed ongoing use of spironolactone.  Assessment    Ruptured implant, right breast.  Clinical history suggestive of depression.    Plan There is a tremendous discrepancy between the patient's reported symptomatology and the daughter's report of the patient's complaints regarding her breast.  The daughter has 2 issues #1 a general decrease in activity, essentially taking to her chair, refusal to participate in activities as well as her frequent complaints of severe right chest pain.  The patient totally diminishes the reports of breast pain and describes no tenderness during breast exam today.  The patient requested a head to toe MRI.  We discussed that this is not a reasonable request, and that during her ED evaluation last month head CT was obtained showing no  intracranial lesions.  By report the patient is seen her cardiologist and her PCP and attempts at medical management for suspected "anxiety" resulted in an adverse effect from medication use.  The radiologist had recommended MRI, I suspect on the possibility of the rare occurrence of lymphoma associated with breast implants.  At this time, I offered the patient an MRI if this would alleviate her concerns regarding the breast, although I expressed my concerns to the patient and her family that the breast while the physical focus of events was not the source of  her present change in affect, energy and engagement.  She does not want surgery, which sort of puts the daughter in a Catch-22 situation: Listening to frequent complaints of pain, unwillingness to do anything about it.  At this time, I will wait to hear with her decision regarding her breast MRI.  While she is claustrophobic she did tolerated an open MRI for other indications in the past, and I suspect with some oral analgesic/sedative she will tolerate a prone position breast MRI without too much difficulty.    HPI, Physical Exam, Assessment and Plan have been scribed under the direction and in the presence of Cindy Ard, Md.  Cindy Macdonald, CMA  I have completed the exam and reviewed the above documentation for accuracy and completeness.  I agree with the above.  Haematologist has been used and any errors in dictation or transcription are unintentional.  Cindy Macdonald, M.D., F.A.C.S.  Forest Gleason Byrnett 02/07/2018, 7:12 AM

## 2018-02-06 NOTE — Patient Instructions (Addendum)
Patient has a right breast ruptured implant with tenderness. Patient will think about having a breast MRI.

## 2018-02-19 DIAGNOSIS — R42 Dizziness and giddiness: Secondary | ICD-10-CM | POA: Diagnosis not present

## 2018-02-19 DIAGNOSIS — R0789 Other chest pain: Secondary | ICD-10-CM | POA: Diagnosis not present

## 2018-02-19 DIAGNOSIS — I10 Essential (primary) hypertension: Secondary | ICD-10-CM | POA: Diagnosis not present

## 2018-02-19 DIAGNOSIS — R110 Nausea: Secondary | ICD-10-CM | POA: Diagnosis not present

## 2018-02-19 DIAGNOSIS — Z6826 Body mass index (BMI) 26.0-26.9, adult: Secondary | ICD-10-CM | POA: Diagnosis not present

## 2018-02-19 DIAGNOSIS — F419 Anxiety disorder, unspecified: Secondary | ICD-10-CM | POA: Diagnosis not present

## 2018-02-19 DIAGNOSIS — R634 Abnormal weight loss: Secondary | ICD-10-CM | POA: Diagnosis not present

## 2018-02-26 DIAGNOSIS — R0789 Other chest pain: Secondary | ICD-10-CM | POA: Diagnosis not present

## 2018-02-26 DIAGNOSIS — R634 Abnormal weight loss: Secondary | ICD-10-CM | POA: Diagnosis not present

## 2018-02-26 DIAGNOSIS — K769 Liver disease, unspecified: Secondary | ICD-10-CM | POA: Diagnosis not present

## 2018-02-26 DIAGNOSIS — R079 Chest pain, unspecified: Secondary | ICD-10-CM | POA: Diagnosis not present

## 2018-02-26 DIAGNOSIS — K449 Diaphragmatic hernia without obstruction or gangrene: Secondary | ICD-10-CM | POA: Diagnosis not present

## 2018-03-01 DIAGNOSIS — I10 Essential (primary) hypertension: Secondary | ICD-10-CM | POA: Diagnosis not present

## 2018-03-01 DIAGNOSIS — F419 Anxiety disorder, unspecified: Secondary | ICD-10-CM | POA: Diagnosis not present

## 2018-03-01 DIAGNOSIS — R634 Abnormal weight loss: Secondary | ICD-10-CM | POA: Diagnosis not present

## 2018-03-01 DIAGNOSIS — R0789 Other chest pain: Secondary | ICD-10-CM | POA: Diagnosis not present

## 2018-03-01 DIAGNOSIS — N644 Mastodynia: Secondary | ICD-10-CM | POA: Diagnosis not present

## 2018-03-01 DIAGNOSIS — Z23 Encounter for immunization: Secondary | ICD-10-CM | POA: Diagnosis not present

## 2018-03-01 DIAGNOSIS — Z6826 Body mass index (BMI) 26.0-26.9, adult: Secondary | ICD-10-CM | POA: Diagnosis not present

## 2018-03-23 ENCOUNTER — Ambulatory Visit: Payer: PPO | Admitting: Cardiovascular Disease

## 2018-04-02 DIAGNOSIS — I10 Essential (primary) hypertension: Secondary | ICD-10-CM | POA: Diagnosis not present

## 2018-04-02 DIAGNOSIS — R634 Abnormal weight loss: Secondary | ICD-10-CM | POA: Diagnosis not present

## 2018-04-02 DIAGNOSIS — N644 Mastodynia: Secondary | ICD-10-CM | POA: Diagnosis not present

## 2018-04-02 DIAGNOSIS — F419 Anxiety disorder, unspecified: Secondary | ICD-10-CM | POA: Diagnosis not present

## 2018-06-22 DIAGNOSIS — M542 Cervicalgia: Secondary | ICD-10-CM | POA: Diagnosis not present

## 2018-06-22 DIAGNOSIS — Z6826 Body mass index (BMI) 26.0-26.9, adult: Secondary | ICD-10-CM | POA: Diagnosis not present

## 2018-06-22 DIAGNOSIS — I10 Essential (primary) hypertension: Secondary | ICD-10-CM | POA: Diagnosis not present

## 2018-06-22 DIAGNOSIS — F419 Anxiety disorder, unspecified: Secondary | ICD-10-CM | POA: Diagnosis not present

## 2018-06-22 DIAGNOSIS — E782 Mixed hyperlipidemia: Secondary | ICD-10-CM | POA: Diagnosis not present

## 2018-06-22 DIAGNOSIS — B0229 Other postherpetic nervous system involvement: Secondary | ICD-10-CM | POA: Diagnosis not present

## 2018-06-22 DIAGNOSIS — K2270 Barrett's esophagus without dysplasia: Secondary | ICD-10-CM | POA: Diagnosis not present

## 2018-10-16 ENCOUNTER — Telehealth: Payer: Self-pay

## 2018-10-16 NOTE — Telephone Encounter (Signed)
Spoke with patient to schedule overdue F/U with Dr. Fletcher Anon. Patient states she does not feel she needs to see cardiology any longer and she is going to call her PCP to refill amlodipine. Patient states she will call back to schedule an appointment if PCP will not agree to refill it.

## 2018-12-25 DIAGNOSIS — K2270 Barrett's esophagus without dysplasia: Secondary | ICD-10-CM | POA: Diagnosis not present

## 2018-12-25 DIAGNOSIS — B0229 Other postherpetic nervous system involvement: Secondary | ICD-10-CM | POA: Diagnosis not present

## 2018-12-25 DIAGNOSIS — M25519 Pain in unspecified shoulder: Secondary | ICD-10-CM | POA: Diagnosis not present

## 2018-12-25 DIAGNOSIS — N644 Mastodynia: Secondary | ICD-10-CM | POA: Diagnosis not present

## 2018-12-25 DIAGNOSIS — M542 Cervicalgia: Secondary | ICD-10-CM | POA: Diagnosis not present

## 2018-12-25 DIAGNOSIS — E782 Mixed hyperlipidemia: Secondary | ICD-10-CM | POA: Diagnosis not present

## 2018-12-25 DIAGNOSIS — R5383 Other fatigue: Secondary | ICD-10-CM | POA: Diagnosis not present

## 2018-12-25 DIAGNOSIS — F419 Anxiety disorder, unspecified: Secondary | ICD-10-CM | POA: Diagnosis not present

## 2018-12-25 DIAGNOSIS — I10 Essential (primary) hypertension: Secondary | ICD-10-CM | POA: Diagnosis not present

## 2018-12-25 DIAGNOSIS — Z1331 Encounter for screening for depression: Secondary | ICD-10-CM | POA: Diagnosis not present

## 2018-12-25 DIAGNOSIS — Z9181 History of falling: Secondary | ICD-10-CM | POA: Diagnosis not present

## 2018-12-25 DIAGNOSIS — Z139 Encounter for screening, unspecified: Secondary | ICD-10-CM | POA: Diagnosis not present

## 2019-01-07 DIAGNOSIS — G8929 Other chronic pain: Secondary | ICD-10-CM | POA: Diagnosis not present

## 2019-01-07 DIAGNOSIS — M19012 Primary osteoarthritis, left shoulder: Secondary | ICD-10-CM | POA: Diagnosis not present

## 2019-01-07 DIAGNOSIS — M25512 Pain in left shoulder: Secondary | ICD-10-CM | POA: Diagnosis not present

## 2019-01-21 DIAGNOSIS — Z6831 Body mass index (BMI) 31.0-31.9, adult: Secondary | ICD-10-CM | POA: Diagnosis not present

## 2019-01-21 DIAGNOSIS — R600 Localized edema: Secondary | ICD-10-CM | POA: Diagnosis not present

## 2019-01-21 DIAGNOSIS — R82998 Other abnormal findings in urine: Secondary | ICD-10-CM | POA: Diagnosis not present

## 2019-02-01 DIAGNOSIS — M19012 Primary osteoarthritis, left shoulder: Secondary | ICD-10-CM | POA: Diagnosis not present

## 2019-02-01 DIAGNOSIS — R8290 Unspecified abnormal findings in urine: Secondary | ICD-10-CM | POA: Diagnosis not present

## 2019-02-01 DIAGNOSIS — Z6831 Body mass index (BMI) 31.0-31.9, adult: Secondary | ICD-10-CM | POA: Diagnosis not present

## 2019-02-01 DIAGNOSIS — R609 Edema, unspecified: Secondary | ICD-10-CM | POA: Diagnosis not present

## 2019-02-01 DIAGNOSIS — R5383 Other fatigue: Secondary | ICD-10-CM | POA: Diagnosis not present

## 2019-02-01 DIAGNOSIS — I10 Essential (primary) hypertension: Secondary | ICD-10-CM | POA: Diagnosis not present

## 2019-02-07 DIAGNOSIS — D649 Anemia, unspecified: Secondary | ICD-10-CM | POA: Diagnosis not present

## 2019-02-14 DIAGNOSIS — R1310 Dysphagia, unspecified: Secondary | ICD-10-CM | POA: Diagnosis not present

## 2019-02-14 DIAGNOSIS — I10 Essential (primary) hypertension: Secondary | ICD-10-CM | POA: Diagnosis not present

## 2019-02-14 DIAGNOSIS — D500 Iron deficiency anemia secondary to blood loss (chronic): Secondary | ICD-10-CM | POA: Diagnosis not present

## 2019-02-14 DIAGNOSIS — Z8719 Personal history of other diseases of the digestive system: Secondary | ICD-10-CM | POA: Diagnosis not present

## 2019-02-14 DIAGNOSIS — F419 Anxiety disorder, unspecified: Secondary | ICD-10-CM | POA: Diagnosis not present

## 2019-02-14 DIAGNOSIS — E538 Deficiency of other specified B group vitamins: Secondary | ICD-10-CM | POA: Diagnosis not present

## 2019-02-14 DIAGNOSIS — J449 Chronic obstructive pulmonary disease, unspecified: Secondary | ICD-10-CM | POA: Diagnosis not present

## 2019-03-01 ENCOUNTER — Other Ambulatory Visit
Admission: RE | Admit: 2019-03-01 | Discharge: 2019-03-01 | Disposition: A | Discharge status: Home / Self Care | Payer: PPO | Source: Ambulatory Visit | Attending: General Surgery | Admitting: General Surgery

## 2019-03-01 DIAGNOSIS — Z01812 Encounter for preprocedural laboratory examination: Secondary | ICD-10-CM | POA: Insufficient documentation

## 2019-03-01 DIAGNOSIS — Z20828 Contact with and (suspected) exposure to other viral communicable diseases: Secondary | ICD-10-CM | POA: Diagnosis not present

## 2019-03-01 LAB — SARS CORONAVIRUS 2 (TAT 6-24 HRS): SARS Coronavirus 2: NEGATIVE

## 2019-03-06 ENCOUNTER — Ambulatory Visit
Admission: RE | Admit: 2019-03-06 | Discharge: 2019-03-06 | Disposition: A | Discharge status: Home / Self Care | Payer: PPO | Attending: General Surgery | Admitting: General Surgery

## 2019-03-06 ENCOUNTER — Encounter
Admission: RE | Disposition: A | Discharge status: Home / Self Care | Payer: Self-pay | Source: Home / Self Care | Attending: General Surgery

## 2019-03-06 ENCOUNTER — Ambulatory Visit: Payer: PPO | Admitting: Certified Registered Nurse Anesthetist

## 2019-03-06 ENCOUNTER — Other Ambulatory Visit: Payer: Self-pay

## 2019-03-06 ENCOUNTER — Encounter: Payer: Self-pay | Admitting: *Deleted

## 2019-03-06 DIAGNOSIS — E785 Hyperlipidemia, unspecified: Secondary | ICD-10-CM | POA: Diagnosis not present

## 2019-03-06 DIAGNOSIS — Z885 Allergy status to narcotic agent status: Secondary | ICD-10-CM | POA: Diagnosis not present

## 2019-03-06 DIAGNOSIS — K219 Gastro-esophageal reflux disease without esophagitis: Secondary | ICD-10-CM | POA: Diagnosis not present

## 2019-03-06 DIAGNOSIS — D123 Benign neoplasm of transverse colon: Secondary | ICD-10-CM | POA: Insufficient documentation

## 2019-03-06 DIAGNOSIS — D126 Benign neoplasm of colon, unspecified: Secondary | ICD-10-CM | POA: Diagnosis not present

## 2019-03-06 DIAGNOSIS — J449 Chronic obstructive pulmonary disease, unspecified: Secondary | ICD-10-CM | POA: Diagnosis not present

## 2019-03-06 DIAGNOSIS — Z79899 Other long term (current) drug therapy: Secondary | ICD-10-CM | POA: Diagnosis not present

## 2019-03-06 DIAGNOSIS — Z882 Allergy status to sulfonamides status: Secondary | ICD-10-CM | POA: Diagnosis not present

## 2019-03-06 DIAGNOSIS — D509 Iron deficiency anemia, unspecified: Secondary | ICD-10-CM | POA: Insufficient documentation

## 2019-03-06 DIAGNOSIS — K449 Diaphragmatic hernia without obstruction or gangrene: Secondary | ICD-10-CM | POA: Insufficient documentation

## 2019-03-06 DIAGNOSIS — K317 Polyp of stomach and duodenum: Secondary | ICD-10-CM | POA: Insufficient documentation

## 2019-03-06 DIAGNOSIS — K635 Polyp of colon: Secondary | ICD-10-CM | POA: Diagnosis not present

## 2019-03-06 DIAGNOSIS — K2270 Barrett's esophagus without dysplasia: Secondary | ICD-10-CM | POA: Diagnosis not present

## 2019-03-06 DIAGNOSIS — Z8719 Personal history of other diseases of the digestive system: Secondary | ICD-10-CM | POA: Diagnosis not present

## 2019-03-06 DIAGNOSIS — Z888 Allergy status to other drugs, medicaments and biological substances status: Secondary | ICD-10-CM | POA: Diagnosis not present

## 2019-03-06 DIAGNOSIS — K441 Diaphragmatic hernia with gangrene: Secondary | ICD-10-CM | POA: Diagnosis not present

## 2019-03-06 DIAGNOSIS — K2980 Duodenitis without bleeding: Secondary | ICD-10-CM | POA: Diagnosis not present

## 2019-03-06 DIAGNOSIS — I10 Essential (primary) hypertension: Secondary | ICD-10-CM | POA: Insufficient documentation

## 2019-03-06 HISTORY — PX: ESOPHAGOGASTRODUODENOSCOPY (EGD) WITH PROPOFOL: SHX5813

## 2019-03-06 HISTORY — PX: COLONOSCOPY WITH PROPOFOL: SHX5780

## 2019-03-06 SURGERY — ESOPHAGOGASTRODUODENOSCOPY (EGD) WITH PROPOFOL
Anesthesia: General

## 2019-03-06 MED ORDER — PROPOFOL 500 MG/50ML IV EMUL
INTRAVENOUS | Status: DC | PRN
  Administered 2019-03-06: 150 ug/kg/min via INTRAVENOUS

## 2019-03-06 MED ORDER — ONDANSETRON HCL 4 MG/2ML IJ SOLN
INTRAMUSCULAR | Status: AC
  Filled 2019-03-06: qty 2

## 2019-03-06 MED ORDER — PROPOFOL 10 MG/ML IV BOLUS
INTRAVENOUS | Status: AC
  Filled 2019-03-06: qty 20

## 2019-03-06 MED ORDER — PROPOFOL 10 MG/ML IV BOLUS
INTRAVENOUS | Status: DC | PRN
  Administered 2019-03-06: 40 mg via INTRAVENOUS
  Administered 2019-03-06 (×2): 20 mg via INTRAVENOUS

## 2019-03-06 MED ORDER — SODIUM CHLORIDE 0.9 % IV SOLN
INTRAVENOUS | Status: DC
  Administered 2019-03-06: 09:00:00 via INTRAVENOUS

## 2019-03-06 MED ORDER — PHENYLEPHRINE HCL (PRESSORS) 10 MG/ML IV SOLN
INTRAVENOUS | Status: DC | PRN
  Administered 2019-03-06 (×2): 100 ug via INTRAVENOUS

## 2019-03-06 MED ORDER — ONDANSETRON HCL 4 MG/2ML IJ SOLN
4.0000 mg | Freq: Once | INTRAMUSCULAR | Status: AC
  Administered 2019-03-06: 4 mg via INTRAVENOUS

## 2019-03-06 MED ORDER — LIDOCAINE HCL (CARDIAC) PF 100 MG/5ML IV SOSY
PREFILLED_SYRINGE | INTRAVENOUS | Status: DC | PRN
  Administered 2019-03-06: 50 mg via INTRAVENOUS

## 2019-03-06 MED ORDER — ONDANSETRON HCL 4 MG/2ML IJ SOLN
INTRAMUSCULAR | Status: AC
  Administered 2019-03-06: 12:00:00
  Filled 2019-03-06: qty 2

## 2019-03-06 MED ORDER — PROPOFOL 500 MG/50ML IV EMUL
INTRAVENOUS | Status: AC
  Filled 2019-03-06: qty 50

## 2019-03-06 NOTE — OR Nursing (Signed)
Pt with n/v. Green emesis.spoke with dr Ronelle Nigh. md orders zofran 4mg  iv once.pt tolerated well. Less emesis

## 2019-03-06 NOTE — Op Note (Signed)
[facility] Gastroenterology Patient Name: Leketa Macdonald Procedure Date: 03/06/2019 9:45 AM MRN: [id number] Account #: [id number] Date of Birth: 05/08/1941 Admit Type: Outpatient Age: 78 Room: [facility] ENDO ROOM 1 Gender: Female Note Status: Finalized Procedure:            Upper GI endoscopy Providers:            Robert Bellow, MD Medicines:            Monitored Anesthesia Care Complications:        No immediate complications. Procedure:            Pre-Anesthesia Assessment:                       - Prior to the procedure, a History and Physical was                        performed, and patient medications, allergies and                        sensitivities were reviewed. The patient's tolerance of                        previous anesthesia was reviewed.                       - The risks and benefits of the procedure and the                        sedation options and risks were discussed with the                        patient. All questions were answered and informed                        consent was obtained.                       After obtaining informed consent, the endoscope was                        passed under direct vision. Throughout the procedure,                        the patient's blood pressure, pulse, and oxygen                        saturations were monitored continuously. The Endoscope                        was introduced through the mouth, and advanced to the                        second part of duodenum. The upper GI endoscopy was                        accomplished without difficulty. The patient tolerated                        the procedure well. Findings:      A small hiatal hernia was present.  A single 5 mm sessile polyp with no bleeding and no stigmata of recent       bleeding was found in the gastric body. Biopsies were taken with a cold       forceps for histology.      Diffuse mild inflammation was found in the duodenal  bulb. This was       biopsied with a cold forceps for histology. Impression:           - Small hiatal hernia.                       - A single gastric polyp. Biopsied.                       - Duodenitis. Biopsied. Recommendation:       - Telephone endoscopist for pathology results in 1 week. Procedure Code(s):    --- Professional ---                       [phone], Esophagogastroduodenoscopy, flexible, transoral;                        with biopsy, single or multiple Diagnosis Code(s):    --- Professional ---                       K44.9, Diaphragmatic hernia without obstruction or                        gangrene                       K31.7, Polyp of stomach and duodenum                       K29.80, Duodenitis without bleeding CPT copyright 2019 [employer]. All rights reserved. The codes documented in this report are preliminary and upon coder review may  be revised to meet current compliance requirements. Robert Bellow, MD 03/06/2019 10:17:24 AM This report has been signed electronically. Number of Addenda: 0 Note Initiated On: 03/06/2019 9:45 AM Estimated Blood Loss: Estimated blood loss: none.      [facility]

## 2019-03-06 NOTE — Anesthesia Post-op Follow-up Note (Signed)
Anesthesia QCDR form completed.        

## 2019-03-06 NOTE — H&P (Signed)
Cindy Macdonald [id number] December 02, 1940     HPI:  Healthy 78 y/o woman recently noted she was more tired than normal.  HGB was found to be low at 8.7.  Low iron stores. Feeling better since beginning iron therapy. HGB now up > 10.  No change in baseline heartburn. No change in stools. No blood or mucous noted in her stools.   Medications Prior to Admission  Medication Sig Dispense Refill Last Dose  . amLODipine (NORVASC) 5 MG tablet Take 1 tablet (5 mg total) by mouth daily. 90 tablet 3 03/06/2019 at Unknown time  . carvedilol (COREG) 12.5 MG tablet Take 1 tablet (12.5 mg total) by mouth 2 (two) times daily. 180 tablet 3 03/06/2019 at Unknown time  . clonazePAM (KLONOPIN) 0.5 MG tablet Take 0.5 mg by mouth 2 (two) times daily as needed for anxiety.     . cyclobenzaprine (FLEXERIL) 5 MG tablet Take 5 mg by mouth 3 (three) times daily as needed for muscle spasms.     Marland Kitchen gabapentin (NEURONTIN) 600 MG tablet Take 1,200 mg by mouth at bedtime.    03/05/2019 at Unknown time  . lansoprazole (PREVACID) 30 MG capsule Take 30 mg by mouth daily.  3 03/05/2019 at Unknown time  . losartan (COZAAR) 100 MG tablet Take 100 mg by mouth at bedtime.    03/05/2019 at Unknown time  . meclizine (ANTIVERT) 25 MG tablet Take 25 mg by mouth 3 (three) times daily as needed for dizziness.     . Omega-3 Fatty Acids (FISH OIL) 1200 MG CAPS Take 2,400 mg by mouth at bedtime.   Past Week at Unknown time  . pravastatin (PRAVACHOL) 40 MG tablet Take 40 mg by mouth at bedtime.    03/05/2019 at Unknown time  . valsartan (DIOVAN) 160 MG tablet Take 160 mg by mouth daily.     Marland Kitchen acetaminophen (TYLENOL) 500 MG tablet Take 1,000 mg by mouth every 6 (six) hours as needed for headache (pain).      Marland Kitchen albuterol (PROVENTIL HFA;VENTOLIN HFA) 108 (90 Base) MCG/ACT inhaler Inhale 2 puffs into the lungs every 6 (six) hours as needed for wheezing or shortness of breath.      . cloNIDine (CATAPRES) 0.1 MG tablet Take 1 tablet (0.1 mg total) by mouth  as needed. (Patient taking differently: Take 0.1 mg by mouth 2 (two) times daily as needed (BP >170/100). ) 60 tablet 0   . Flaxseed, Linseed, (FLAXSEED OIL) 1000 MG CAPS Take 1,000 mg by mouth at bedtime.     . Magnesium Oxide 250 MG TABS Take 500 mg by mouth at bedtime.     Marland Kitchen spironolactone (ALDACTONE) 25 MG tablet Take 1 tablet (25 mg total) by mouth daily. (Patient taking differently: Take 25 mg by mouth every evening. ) 90 tablet 3   . vitamin C (ASCORBIC ACID) 500 MG tablet Take 500 mg by mouth at bedtime.      . vitamin E 400 UNIT capsule Take 400 Units by mouth at bedtime.       Allergies  Allergen Reactions  . Codeine Nausea And Vomiting  . Propoxyphene Nausea And Vomiting    Reaction to Darvocet  . Ace Inhibitors Cough  . Sulfa Antibiotics Other (See Comments)    Unknown reaction   Past Medical History:  Diagnosis Date  . Barrett's esophagus   . Benign tumor of parathyroid gland   . Colon polyps   . COPD (chronic obstructive pulmonary disease) ([place])   . GERD (  gastroesophageal reflux disease)   . Hx of breast implants, bilateral   . Hyperlipidemia   . Hypertension    Past Surgical History:  Procedure Laterality Date  . COLONOSCOPY  2015   Dr [name]  . HEMORRHOID SURGERY    . PARATHYROIDECTOMY    . PLACEMENT OF BREAST IMPLANTS     age 78  . UPPER GI ENDOSCOPY     dr Vira Agar   Social History   Socioeconomic History  . Marital status: Widowed    Spouse name: Not on file  . Number of children: Not on file  . Years of education: Not on file  . Highest education level: Not on file  Occupational History  . Not on file  Social Needs  . Financial resource strain: Not on file  . Food insecurity    Worry: Not on file    Inability: Not on file  . Transportation needs    Medical: Not on file    Non-medical: Not on file  Tobacco Use  . Smoking status: Never Smoker  . Smokeless tobacco: Never Used  Substance and Sexual Activity  . Alcohol use: No  . Drug use: No   . Sexual activity: Not on file  Lifestyle  . Physical activity    Days per week: Not on file    Minutes per session: Not on file  . Stress: Not on file  Relationships  . Social Herbalist on phone: Not on file    Gets together: Not on file    Attends religious service: Not on file    Active member of club or organization: Not on file    Attends meetings of clubs or organizations: Not on file    Relationship status: Not on file  . Intimate partner violence    Fear of current or ex partner: Not on file    Emotionally abused: Not on file    Physically abused: Not on file    Forced sexual activity: Not on file  Other Topics Concern  . Not on file  Social History Narrative  . Not on file   Social History   Social History Narrative  . Not on file     ROS: Negative.     PE: HEENT: Negative. Lungs: Clear. Cardio: RR. Abdomen: Soft.  Assessment/Plan:  Proceed with planned endoscopy. Forest Gleason Kelden Lavallee 03/06/2019

## 2019-03-06 NOTE — Anesthesia Postprocedure Evaluation (Signed)
Anesthesia Post Note  Patient: Cindy Macdonald  Procedure(s) Performed: ESOPHAGOGASTRODUODENOSCOPY (EGD) WITH PROPOFOL (N/A ) COLONOSCOPY WITH PROPOFOL (N/A )  Patient location during evaluation: Endoscopy Anesthesia Type: General Level of consciousness: awake and alert Pain management: pain level controlled Vital Signs Assessment: post-procedure vital signs reviewed and stable Respiratory status: spontaneous breathing and respiratory function stable Cardiovascular status: stable Anesthetic complications: no     Last Vitals:  Vitals:   03/06/19 0917 03/06/19 1037  BP: (!) 153/86 115/66  Pulse: 72 70  Resp: 16 18  Temp: (!) 36.2 C (!) 36.1 C  SpO2: 100% 98%    Last Pain:  Vitals:   03/06/19 1037  TempSrc: Tympanic  PainSc: Asleep                 KEPHART,WILLIAM K

## 2019-03-06 NOTE — Transfer of Care (Signed)
Immediate Anesthesia Transfer of Care Note  Patient: Cindy Macdonald  Procedure(s) Performed: ESOPHAGOGASTRODUODENOSCOPY (EGD) WITH PROPOFOL (N/A ) COLONOSCOPY WITH PROPOFOL (N/A )  Patient Location: PACU  Anesthesia Type:General  Level of Consciousness: awake, alert  and oriented  Airway & Oxygen Therapy: Patient Spontanous Breathing and Patient connected to face mask oxygen  Post-op Assessment: Report given to RN and Post -op Vital signs reviewed and stable  Post vital signs: Reviewed and stable  Last Vitals:  Vitals Value Taken Time  BP    Temp    Pulse    Resp    SpO2      Last Pain:  Vitals:   03/06/19 0917  TempSrc: Tympanic  PainSc: 0-No pain         Complications: No apparent anesthesia complications

## 2019-03-06 NOTE — Anesthesia Preprocedure Evaluation (Signed)
Anesthesia Evaluation  Patient identified by MRN, date of birth, ID band Patient awake    Reviewed: Allergy & Precautions, NPO status , Patient's Chart, lab work & pertinent test results  History of Anesthesia Complications Negative for: history of anesthetic complications  Airway Mallampati: I       Dental   Pulmonary neg sleep apnea, COPD (not using inhaler), Not current smoker,           Cardiovascular hypertension, Pt. on medications (-) Past MI and (-) CHF (-) dysrhythmias + Valvular Problems/Murmurs (murmur, no tx)      Neuro/Psych neg Seizures    GI/Hepatic Neg liver ROS, GERD  Medicated and Controlled,  Endo/Other  neg diabetes  Renal/GU negative Renal ROS     Musculoskeletal   Abdominal   Peds  Hematology   Anesthesia Other Findings   Reproductive/Obstetrics                             Anesthesia Physical Anesthesia Plan  ASA: III  Anesthesia Plan: General   Post-op Pain Management:    Induction: Intravenous  PONV Risk Score and Plan: 3 and Propofol infusion, TIVA and Ondansetron  Airway Management Planned: Nasal Cannula  Additional Equipment:   Intra-op Plan:   Post-operative Plan:   Informed Consent: I have reviewed the patients History and Physical, chart, labs and discussed the procedure including the risks, benefits and alternatives for the proposed anesthesia with the patient or authorized representative who has indicated his/her understanding and acceptance.       Plan Discussed with:   Anesthesia Plan Comments:         Anesthesia Quick Evaluation

## 2019-03-06 NOTE — OR Nursing (Signed)
AMBULATED TO TOILET . FULLY AWAKE. DENIES ANY NAUSEA. READY FOR DISCHARGE. DR Ronelle Nigh NOTIFIED

## 2019-03-06 NOTE — Op Note (Signed)
[facility] Gastroenterology Patient Name: Cindy Macdonald Procedure Date: 03/06/2019 9:45 AM MRN: [id number] Account #: [id number] Date of Birth: 10/19/1940 Admit Type: Outpatient Age: 78 Room: [facility] ENDO ROOM 1 Gender: Female Note Status: Finalized Procedure:            Colonoscopy Indications:          Iron deficiency anemia Providers:            Robert Bellow, MD Medicines:            Monitored Anesthesia Care Complications:        No immediate complications. Procedure:            Pre-Anesthesia Assessment:                       - Prior to the procedure, a History and Physical was                        performed, and patient medications, allergies and                        sensitivities were reviewed. The patient's tolerance of                        previous anesthesia was reviewed.                       - The risks and benefits of the procedure and the                        sedation options and risks were discussed with the                        patient. All questions were answered and informed                        consent was obtained.                       After obtaining informed consent, the colonoscope was                        passed under direct vision. Throughout the procedure,                        the patient's blood pressure, pulse, and oxygen                        saturations were monitored continuously. The                        Colonoscope was introduced through the anus and                        advanced to the the cecum, identified by appendiceal                        orifice and ileocecal valve. The colonoscopy was                        performed without difficulty. The patient tolerated the  procedure well. The quality of the bowel preparation                        was excellent. Findings:      A 5 mm polyp was found in the transverse colon. The polyp was sessile.       This was biopsied with a cold  forceps for histology.      The mucosa vascular pattern in the mid ascending colon was locally       increased.      The retroflexed view of the distal rectum and anal verge was normal and       showed no anal or rectal abnormalities. Impression:           - One 5 mm polyp in the transverse colon. Biopsied.                       - Increased mucosa vascular pattern in the mid                        ascending colon.                       - The distal rectum and anal verge are normal on                        retroflexion view. Recommendation:       - Telephone endoscopist for pathology results in 1 week. Procedure Code(s):    --- Professional ---                       [phone], Colonoscopy, flexible; with biopsy, single or                        multiple Diagnosis Code(s):    --- Professional ---                       K63.5, Polyp of colon                       D50.9, Iron deficiency anemia, unspecified CPT copyright 2019 American Medical Association. All rights reserved. The codes documented in this report are preliminary and upon coder review may  be revised to meet current compliance requirements. Robert Bellow, MD 03/06/2019 10:36:49 AM This report has been signed electronically. Number of Addenda: 0 Note Initiated On: 03/06/2019 9:45 AM Scope Withdrawal Time: 0 hours 11 minutes 27 seconds  Total Procedure Duration: 0 hours 14 minutes 32 seconds  Estimated Blood Loss: Estimated blood loss: none.      [facility]

## 2019-03-07 ENCOUNTER — Encounter: Payer: Self-pay | Admitting: General Surgery

## 2019-03-07 LAB — SURGICAL PATHOLOGY

## 2019-04-01 DIAGNOSIS — Z20828 Contact with and (suspected) exposure to other viral communicable diseases: Secondary | ICD-10-CM | POA: Diagnosis not present

## 2019-04-01 DIAGNOSIS — R05 Cough: Secondary | ICD-10-CM | POA: Diagnosis not present

## 2019-05-09 DIAGNOSIS — D509 Iron deficiency anemia, unspecified: Secondary | ICD-10-CM | POA: Diagnosis not present

## 2019-06-06 DIAGNOSIS — Z6831 Body mass index (BMI) 31.0-31.9, adult: Secondary | ICD-10-CM | POA: Diagnosis not present

## 2019-06-06 DIAGNOSIS — Z0000 Encounter for general adult medical examination without abnormal findings: Secondary | ICD-10-CM | POA: Diagnosis not present

## 2019-06-06 DIAGNOSIS — Z9181 History of falling: Secondary | ICD-10-CM | POA: Diagnosis not present

## 2019-06-06 DIAGNOSIS — Z1331 Encounter for screening for depression: Secondary | ICD-10-CM | POA: Diagnosis not present

## 2019-06-06 DIAGNOSIS — E785 Hyperlipidemia, unspecified: Secondary | ICD-10-CM | POA: Diagnosis not present

## 2019-07-02 DIAGNOSIS — N644 Mastodynia: Secondary | ICD-10-CM | POA: Diagnosis not present

## 2019-07-02 DIAGNOSIS — Z79899 Other long term (current) drug therapy: Secondary | ICD-10-CM | POA: Diagnosis not present

## 2019-07-02 DIAGNOSIS — D509 Iron deficiency anemia, unspecified: Secondary | ICD-10-CM | POA: Diagnosis not present

## 2019-07-02 DIAGNOSIS — M542 Cervicalgia: Secondary | ICD-10-CM | POA: Diagnosis not present

## 2019-07-02 DIAGNOSIS — M25519 Pain in unspecified shoulder: Secondary | ICD-10-CM | POA: Diagnosis not present

## 2019-07-02 DIAGNOSIS — I10 Essential (primary) hypertension: Secondary | ICD-10-CM | POA: Diagnosis not present

## 2019-07-02 DIAGNOSIS — E782 Mixed hyperlipidemia: Secondary | ICD-10-CM | POA: Diagnosis not present

## 2019-07-02 DIAGNOSIS — K2270 Barrett's esophagus without dysplasia: Secondary | ICD-10-CM | POA: Diagnosis not present

## 2019-07-02 DIAGNOSIS — Z23 Encounter for immunization: Secondary | ICD-10-CM | POA: Diagnosis not present

## 2019-07-02 DIAGNOSIS — B0229 Other postherpetic nervous system involvement: Secondary | ICD-10-CM | POA: Diagnosis not present

## 2019-07-02 DIAGNOSIS — F419 Anxiety disorder, unspecified: Secondary | ICD-10-CM | POA: Diagnosis not present

## 2019-07-02 DIAGNOSIS — Z6830 Body mass index (BMI) 30.0-30.9, adult: Secondary | ICD-10-CM | POA: Diagnosis not present

## 2019-08-19 DIAGNOSIS — I10 Essential (primary) hypertension: Secondary | ICD-10-CM | POA: Diagnosis not present

## 2019-08-19 DIAGNOSIS — Z6830 Body mass index (BMI) 30.0-30.9, adult: Secondary | ICD-10-CM | POA: Diagnosis not present

## 2019-09-25 DIAGNOSIS — F419 Anxiety disorder, unspecified: Secondary | ICD-10-CM | POA: Diagnosis not present

## 2019-09-25 DIAGNOSIS — Z6830 Body mass index (BMI) 30.0-30.9, adult: Secondary | ICD-10-CM | POA: Diagnosis not present

## 2019-09-25 DIAGNOSIS — I10 Essential (primary) hypertension: Secondary | ICD-10-CM | POA: Diagnosis not present

## 2019-09-25 DIAGNOSIS — R609 Edema, unspecified: Secondary | ICD-10-CM | POA: Diagnosis not present

## 2019-10-11 DIAGNOSIS — Z20822 Contact with and (suspected) exposure to covid-19: Secondary | ICD-10-CM | POA: Diagnosis not present

## 2019-10-11 DIAGNOSIS — I10 Essential (primary) hypertension: Secondary | ICD-10-CM | POA: Diagnosis not present

## 2019-10-11 DIAGNOSIS — J441 Chronic obstructive pulmonary disease with (acute) exacerbation: Secondary | ICD-10-CM | POA: Diagnosis not present

## 2019-12-03 DIAGNOSIS — L570 Actinic keratosis: Secondary | ICD-10-CM | POA: Diagnosis not present

## 2019-12-03 DIAGNOSIS — D1800 Hemangioma unspecified site: Secondary | ICD-10-CM | POA: Diagnosis not present

## 2019-12-03 DIAGNOSIS — Z6830 Body mass index (BMI) 30.0-30.9, adult: Secondary | ICD-10-CM | POA: Diagnosis not present

## 2020-01-07 DIAGNOSIS — I10 Essential (primary) hypertension: Secondary | ICD-10-CM | POA: Diagnosis not present

## 2020-01-07 DIAGNOSIS — F419 Anxiety disorder, unspecified: Secondary | ICD-10-CM | POA: Diagnosis not present

## 2020-01-07 DIAGNOSIS — M542 Cervicalgia: Secondary | ICD-10-CM | POA: Diagnosis not present

## 2020-01-07 DIAGNOSIS — N644 Mastodynia: Secondary | ICD-10-CM | POA: Diagnosis not present

## 2020-01-07 DIAGNOSIS — Z6831 Body mass index (BMI) 31.0-31.9, adult: Secondary | ICD-10-CM | POA: Diagnosis not present

## 2020-01-07 DIAGNOSIS — D509 Iron deficiency anemia, unspecified: Secondary | ICD-10-CM | POA: Diagnosis not present

## 2020-01-07 DIAGNOSIS — M25519 Pain in unspecified shoulder: Secondary | ICD-10-CM | POA: Diagnosis not present

## 2020-01-07 DIAGNOSIS — Z139 Encounter for screening, unspecified: Secondary | ICD-10-CM | POA: Diagnosis not present

## 2020-01-07 DIAGNOSIS — K2270 Barrett's esophagus without dysplasia: Secondary | ICD-10-CM | POA: Diagnosis not present

## 2020-01-07 DIAGNOSIS — B0229 Other postherpetic nervous system involvement: Secondary | ICD-10-CM | POA: Diagnosis not present

## 2020-01-07 DIAGNOSIS — E782 Mixed hyperlipidemia: Secondary | ICD-10-CM | POA: Diagnosis not present

## 2020-01-07 DIAGNOSIS — Z79899 Other long term (current) drug therapy: Secondary | ICD-10-CM | POA: Diagnosis not present

## 2020-02-17 DIAGNOSIS — R05 Cough: Secondary | ICD-10-CM | POA: Diagnosis not present

## 2020-02-17 DIAGNOSIS — R519 Headache, unspecified: Secondary | ICD-10-CM | POA: Diagnosis not present

## 2020-02-17 DIAGNOSIS — R5383 Other fatigue: Secondary | ICD-10-CM | POA: Diagnosis not present

## 2020-02-17 DIAGNOSIS — J40 Bronchitis, not specified as acute or chronic: Secondary | ICD-10-CM | POA: Diagnosis not present

## 2020-03-06 IMAGING — CT CT HEAD W/O CM
4 series · 16 of 47 positions shown, 18 images · non-contrast
Comparison: None.

CLINICAL DATA: Altered level of consciousness.  No reported injury.

EXAM:
CT HEAD WITHOUT CONTRAST
TECHNIQUE: Contiguous axial images were obtained from the base of the skull
through the vertex without intravenous contrast.

[Series 3: head without · axial · non-contrast · 0.45mm/px · z∈[-133,-18]mm · 7 of 31 slices shown, 9 images]
[im 4/31  brain]
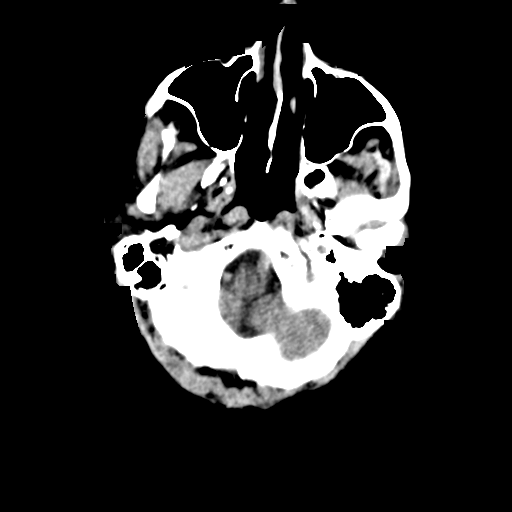
[im 4/31  bone]
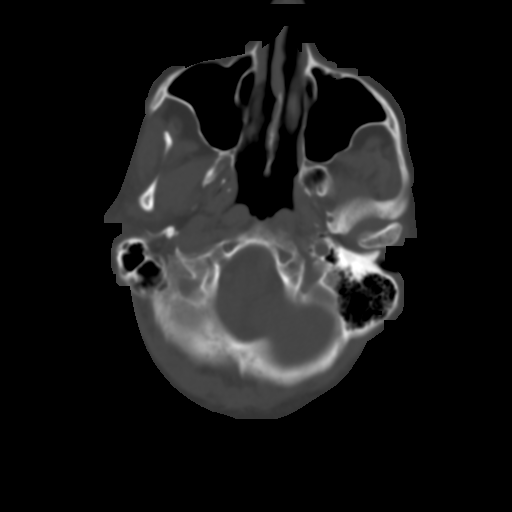
[im 8/31  brain]
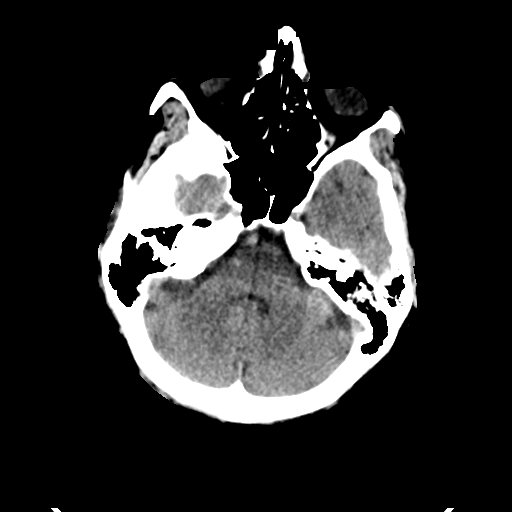
[im 12/31  brain]
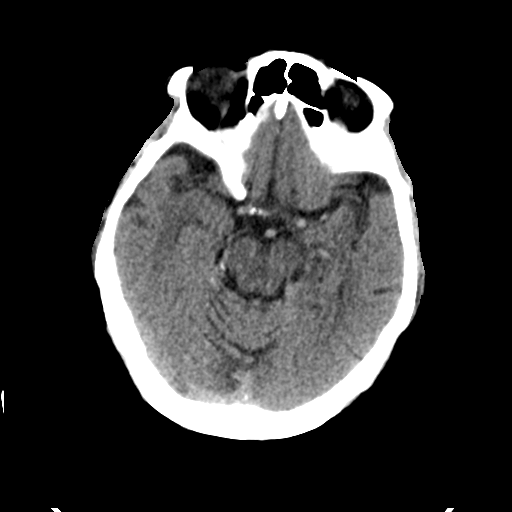
[im 16/31  brain]
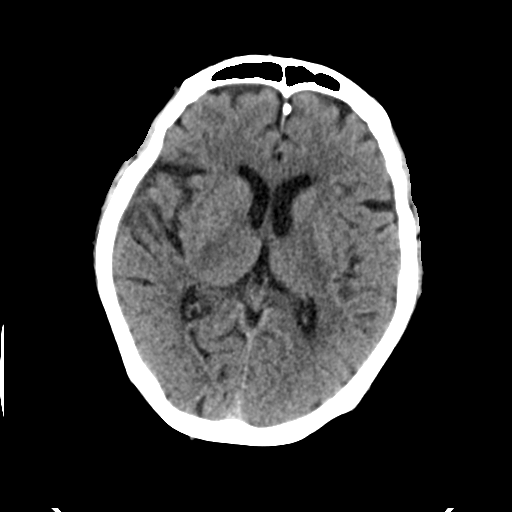
[im 19/31  brain]
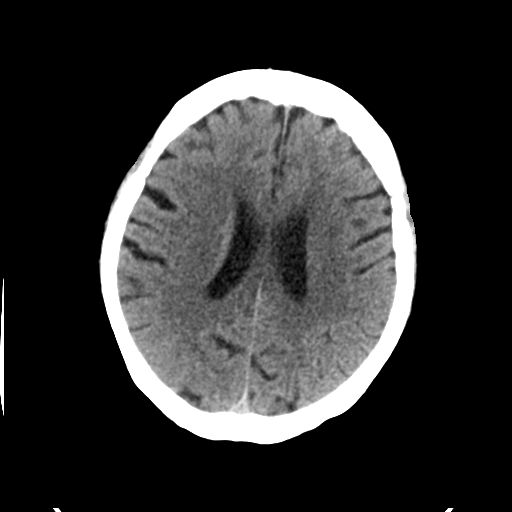
[im 19/31  bone]
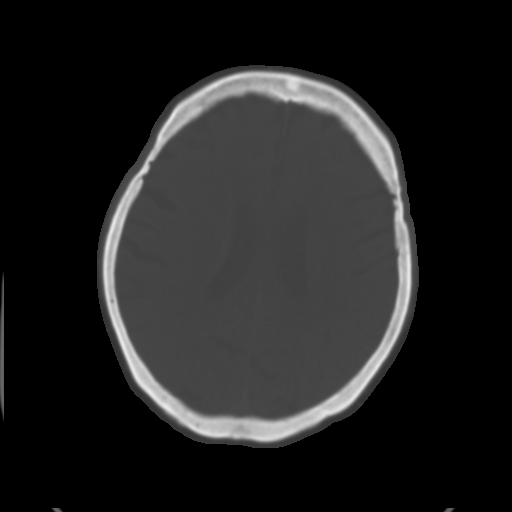
[im 23/31  brain]
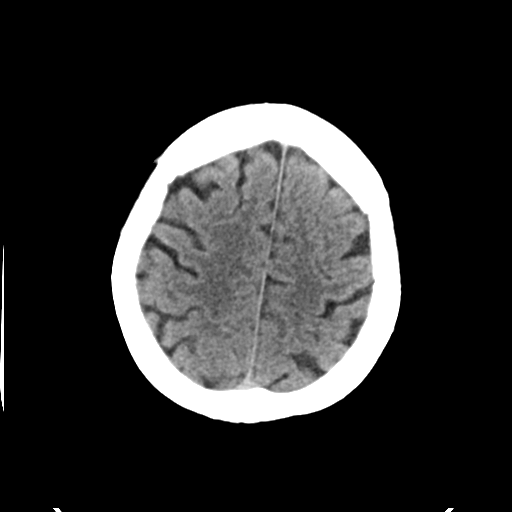
[im 27/31  brain]
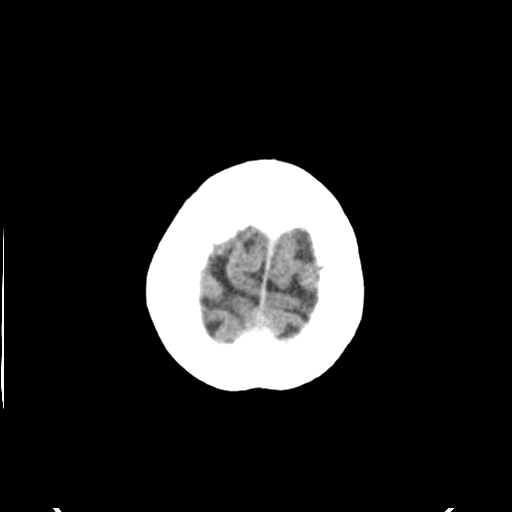

[Series 4: head bone · axial · 0.45mm/px · z∈[-134,-102]mm · 3 of 78 slices shown]
[im 8/78  bone]
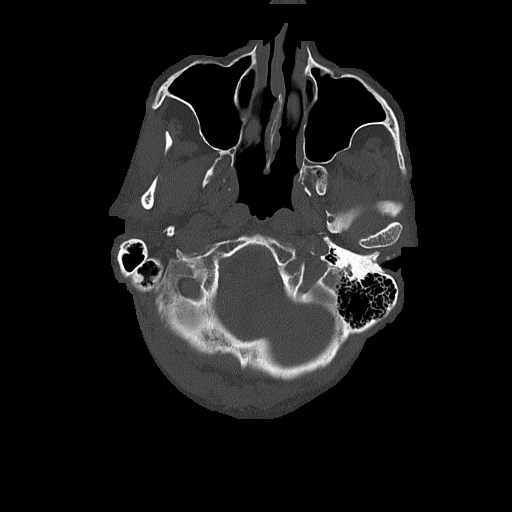
[im 16/78  bone]
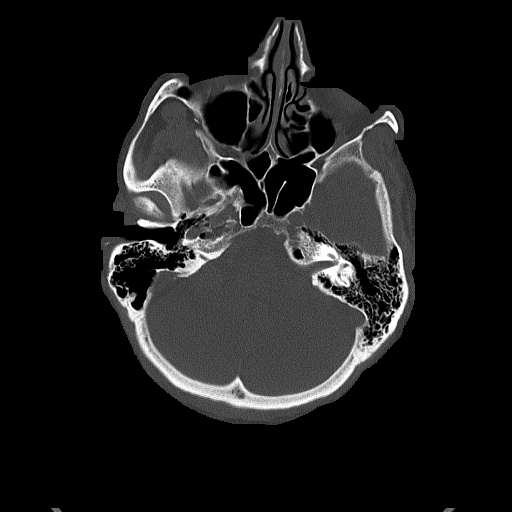
[im 24/78  bone]
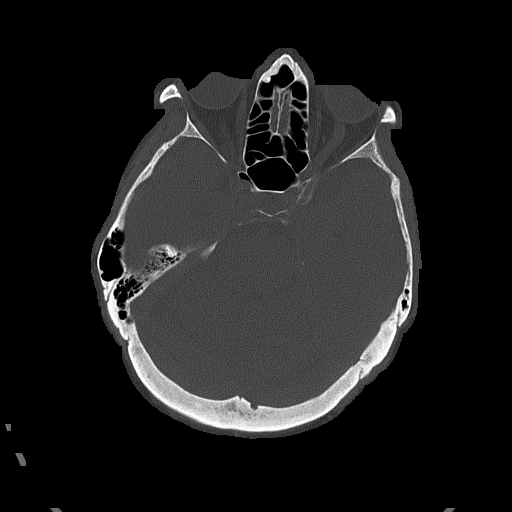

[Series 5: head without cor · coronal · non-contrast · 0.34mm/px · 3 of 73 slices shown]
[im 25/73  brain]
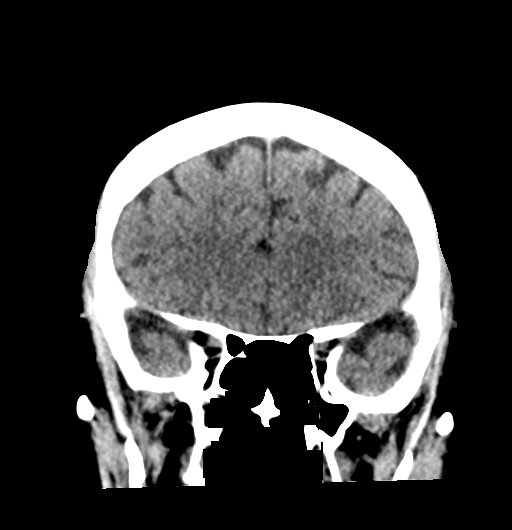
[im 33/73  brain]
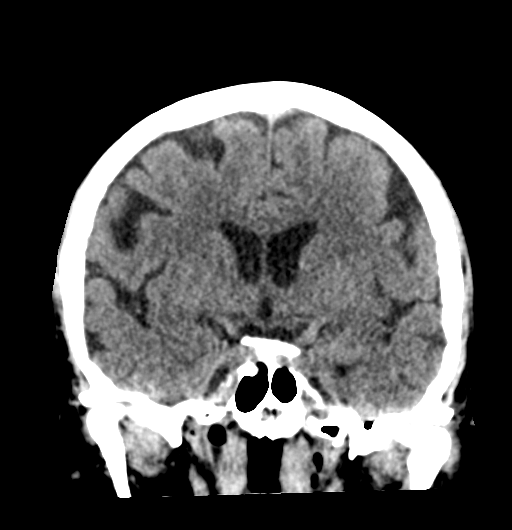
[im 41/73  brain]
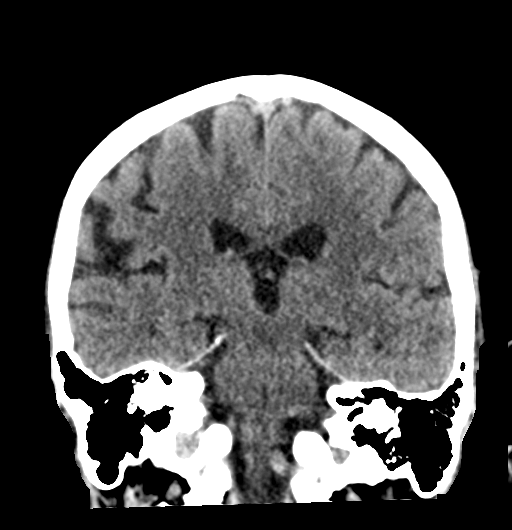

[Series 6: head without sag · sagittal · non-contrast · 0.35mm/px · 3 of 67 slices shown]
[im 23/67  brain]
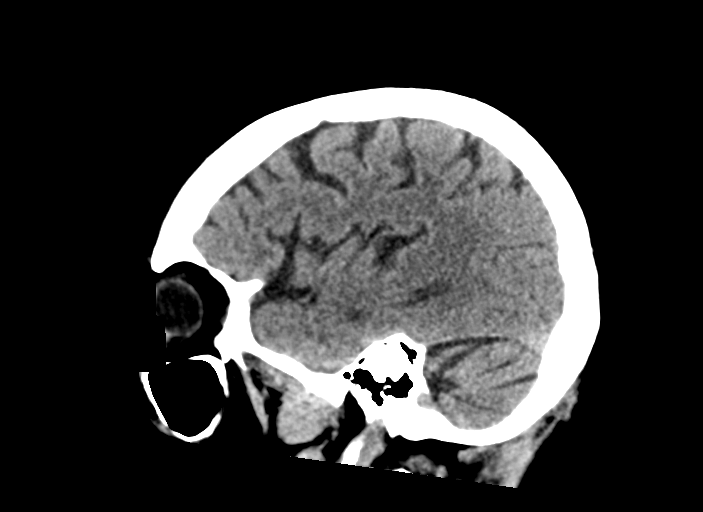
[im 34/67  brain]
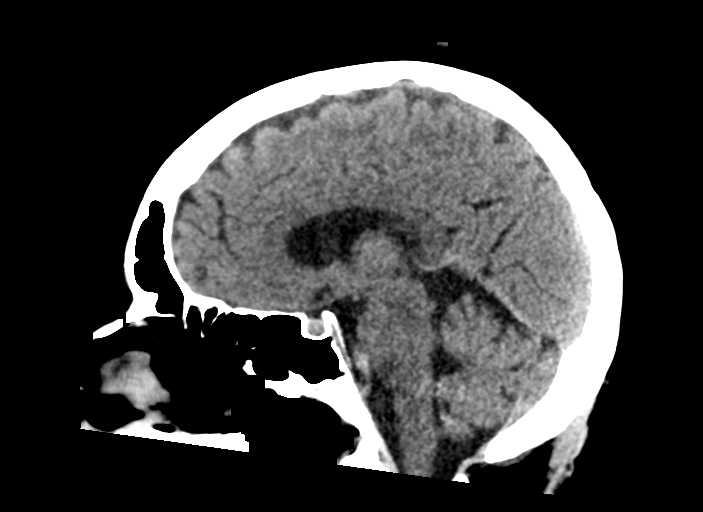
[im 45/67  brain]
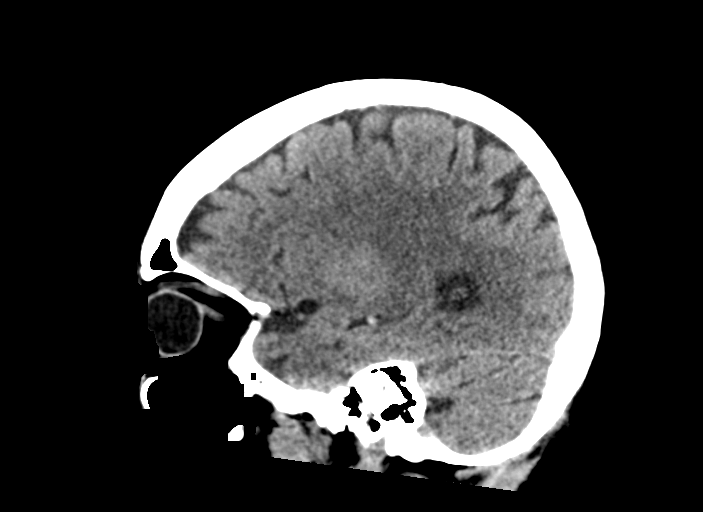

[16 of 47 positions shown; findings below may reference images not displayed]

FINDINGS: Brain: No evidence of parenchymal hemorrhage or extra-axial fluid
collection. No mass lesion, mass effect, or midline shift. No CT
evidence of acute infarction. Nonspecific mild subcortical and
periventricular white matter hypodensity, most in keeping with
chronic small vessel ischemic change. Cerebral volume is age
appropriate. No ventriculomegaly.

Vascular: No acute abnormality.

Skull: No evidence of calvarial fracture.

Sinuses/Orbits: The visualized paranasal sinuses are essentially
clear.

Other:  The mastoid air cells are unopacified.
IMPRESSION: 1.  No evidence of acute intracranial abnormality.
2. Mild chronic small vessel ischemic changes in the cerebral white
matter.

## 2020-04-22 DIAGNOSIS — Z20822 Contact with and (suspected) exposure to covid-19: Secondary | ICD-10-CM | POA: Diagnosis not present

## 2020-04-22 DIAGNOSIS — J441 Chronic obstructive pulmonary disease with (acute) exacerbation: Secondary | ICD-10-CM | POA: Diagnosis not present

## 2020-04-22 DIAGNOSIS — J019 Acute sinusitis, unspecified: Secondary | ICD-10-CM | POA: Diagnosis not present

## 2020-07-09 DIAGNOSIS — Z79899 Other long term (current) drug therapy: Secondary | ICD-10-CM | POA: Diagnosis not present

## 2020-07-09 DIAGNOSIS — Z9181 History of falling: Secondary | ICD-10-CM | POA: Diagnosis not present

## 2020-07-09 DIAGNOSIS — Z1331 Encounter for screening for depression: Secondary | ICD-10-CM | POA: Diagnosis not present

## 2020-07-09 DIAGNOSIS — I10 Essential (primary) hypertension: Secondary | ICD-10-CM | POA: Diagnosis not present

## 2020-07-09 DIAGNOSIS — Z139 Encounter for screening, unspecified: Secondary | ICD-10-CM | POA: Diagnosis not present

## 2020-07-09 DIAGNOSIS — E782 Mixed hyperlipidemia: Secondary | ICD-10-CM | POA: Diagnosis not present

## 2020-07-09 DIAGNOSIS — D509 Iron deficiency anemia, unspecified: Secondary | ICD-10-CM | POA: Diagnosis not present

## 2020-07-09 DIAGNOSIS — M542 Cervicalgia: Secondary | ICD-10-CM | POA: Diagnosis not present

## 2020-07-09 DIAGNOSIS — M25519 Pain in unspecified shoulder: Secondary | ICD-10-CM | POA: Diagnosis not present

## 2020-07-09 DIAGNOSIS — F419 Anxiety disorder, unspecified: Secondary | ICD-10-CM | POA: Diagnosis not present

## 2020-07-09 DIAGNOSIS — B0229 Other postherpetic nervous system involvement: Secondary | ICD-10-CM | POA: Diagnosis not present

## 2020-07-09 DIAGNOSIS — Z23 Encounter for immunization: Secondary | ICD-10-CM | POA: Diagnosis not present

## 2020-07-09 DIAGNOSIS — K2270 Barrett's esophagus without dysplasia: Secondary | ICD-10-CM | POA: Diagnosis not present

## 2020-08-13 DIAGNOSIS — I10 Essential (primary) hypertension: Secondary | ICD-10-CM | POA: Diagnosis not present

## 2020-08-13 DIAGNOSIS — R609 Edema, unspecified: Secondary | ICD-10-CM | POA: Diagnosis not present

## 2020-08-13 DIAGNOSIS — Z6831 Body mass index (BMI) 31.0-31.9, adult: Secondary | ICD-10-CM | POA: Diagnosis not present

## 2020-09-02 DIAGNOSIS — R609 Edema, unspecified: Secondary | ICD-10-CM | POA: Diagnosis not present

## 2020-09-02 DIAGNOSIS — Z6830 Body mass index (BMI) 30.0-30.9, adult: Secondary | ICD-10-CM | POA: Diagnosis not present

## 2020-09-02 DIAGNOSIS — I10 Essential (primary) hypertension: Secondary | ICD-10-CM | POA: Diagnosis not present

## 2020-09-17 DIAGNOSIS — R609 Edema, unspecified: Secondary | ICD-10-CM | POA: Diagnosis not present

## 2020-09-17 DIAGNOSIS — Z6830 Body mass index (BMI) 30.0-30.9, adult: Secondary | ICD-10-CM | POA: Diagnosis not present

## 2020-09-17 DIAGNOSIS — I10 Essential (primary) hypertension: Secondary | ICD-10-CM | POA: Diagnosis not present

## 2020-09-30 DIAGNOSIS — Z6830 Body mass index (BMI) 30.0-30.9, adult: Secondary | ICD-10-CM | POA: Diagnosis not present

## 2020-09-30 DIAGNOSIS — I10 Essential (primary) hypertension: Secondary | ICD-10-CM | POA: Diagnosis not present

## 2020-10-21 DIAGNOSIS — R531 Weakness: Secondary | ICD-10-CM | POA: Diagnosis not present

## 2020-10-21 DIAGNOSIS — K2270 Barrett's esophagus without dysplasia: Secondary | ICD-10-CM | POA: Diagnosis not present

## 2020-10-21 DIAGNOSIS — Z6830 Body mass index (BMI) 30.0-30.9, adult: Secondary | ICD-10-CM | POA: Diagnosis not present

## 2020-10-21 DIAGNOSIS — R609 Edema, unspecified: Secondary | ICD-10-CM | POA: Diagnosis not present

## 2020-10-21 DIAGNOSIS — I10 Essential (primary) hypertension: Secondary | ICD-10-CM | POA: Diagnosis not present

## 2020-10-29 ENCOUNTER — Other Ambulatory Visit: Payer: Self-pay | Admitting: Physician Assistant

## 2020-10-29 DIAGNOSIS — I10 Essential (primary) hypertension: Secondary | ICD-10-CM

## 2020-11-07 ENCOUNTER — Ambulatory Visit
Admission: RE | Admit: 2020-11-07 | Discharge: 2020-11-07 | Disposition: A | Discharge status: Home / Self Care | Payer: PPO | Source: Ambulatory Visit | Attending: Physician Assistant | Admitting: Physician Assistant

## 2020-11-07 ENCOUNTER — Other Ambulatory Visit: Payer: Self-pay

## 2020-11-07 DIAGNOSIS — K449 Diaphragmatic hernia without obstruction or gangrene: Secondary | ICD-10-CM | POA: Diagnosis not present

## 2020-11-07 DIAGNOSIS — N281 Cyst of kidney, acquired: Secondary | ICD-10-CM | POA: Diagnosis not present

## 2020-11-07 DIAGNOSIS — I10 Essential (primary) hypertension: Secondary | ICD-10-CM | POA: Insufficient documentation

## 2020-11-07 DIAGNOSIS — K8020 Calculus of gallbladder without cholecystitis without obstruction: Secondary | ICD-10-CM | POA: Diagnosis not present

## 2020-11-07 MED ORDER — GADOBUTROL 1 MMOL/ML IV SOLN
8.0000 mL | Freq: Once | INTRAVENOUS | Status: AC | PRN
  Administered 2020-11-07: 8 mL via INTRAVENOUS

## 2020-11-11 DIAGNOSIS — Z6831 Body mass index (BMI) 31.0-31.9, adult: Secondary | ICD-10-CM | POA: Diagnosis not present

## 2020-11-11 DIAGNOSIS — K8020 Calculus of gallbladder without cholecystitis without obstruction: Secondary | ICD-10-CM | POA: Diagnosis not present

## 2020-11-11 DIAGNOSIS — I10 Essential (primary) hypertension: Secondary | ICD-10-CM | POA: Diagnosis not present

## 2020-11-11 DIAGNOSIS — M545 Low back pain, unspecified: Secondary | ICD-10-CM | POA: Diagnosis not present

## 2020-12-14 DIAGNOSIS — R609 Edema, unspecified: Secondary | ICD-10-CM | POA: Diagnosis not present

## 2020-12-14 DIAGNOSIS — I10 Essential (primary) hypertension: Secondary | ICD-10-CM | POA: Diagnosis not present

## 2020-12-14 DIAGNOSIS — Z6830 Body mass index (BMI) 30.0-30.9, adult: Secondary | ICD-10-CM | POA: Diagnosis not present

## 2021-01-07 DIAGNOSIS — N644 Mastodynia: Secondary | ICD-10-CM | POA: Diagnosis not present

## 2021-01-07 DIAGNOSIS — K2270 Barrett's esophagus without dysplasia: Secondary | ICD-10-CM | POA: Diagnosis not present

## 2021-01-07 DIAGNOSIS — B0229 Other postherpetic nervous system involvement: Secondary | ICD-10-CM | POA: Diagnosis not present

## 2021-01-07 DIAGNOSIS — M25519 Pain in unspecified shoulder: Secondary | ICD-10-CM | POA: Diagnosis not present

## 2021-01-07 DIAGNOSIS — I10 Essential (primary) hypertension: Secondary | ICD-10-CM | POA: Diagnosis not present

## 2021-01-07 DIAGNOSIS — M542 Cervicalgia: Secondary | ICD-10-CM | POA: Diagnosis not present

## 2021-01-07 DIAGNOSIS — Z79899 Other long term (current) drug therapy: Secondary | ICD-10-CM | POA: Diagnosis not present

## 2021-01-07 DIAGNOSIS — F419 Anxiety disorder, unspecified: Secondary | ICD-10-CM | POA: Diagnosis not present

## 2021-01-07 DIAGNOSIS — R609 Edema, unspecified: Secondary | ICD-10-CM | POA: Diagnosis not present

## 2021-01-07 DIAGNOSIS — D509 Iron deficiency anemia, unspecified: Secondary | ICD-10-CM | POA: Diagnosis not present

## 2021-01-07 DIAGNOSIS — E782 Mixed hyperlipidemia: Secondary | ICD-10-CM | POA: Diagnosis not present

## 2021-01-07 DIAGNOSIS — Z6830 Body mass index (BMI) 30.0-30.9, adult: Secondary | ICD-10-CM | POA: Diagnosis not present

## 2021-03-02 DIAGNOSIS — Z9841 Cataract extraction status, right eye: Secondary | ICD-10-CM | POA: Diagnosis not present

## 2021-03-02 DIAGNOSIS — H52223 Regular astigmatism, bilateral: Secondary | ICD-10-CM | POA: Diagnosis not present

## 2021-03-02 DIAGNOSIS — H26493 Other secondary cataract, bilateral: Secondary | ICD-10-CM | POA: Diagnosis not present

## 2021-03-02 DIAGNOSIS — Z9842 Cataract extraction status, left eye: Secondary | ICD-10-CM | POA: Diagnosis not present

## 2021-04-02 DIAGNOSIS — H40013 Open angle with borderline findings, low risk, bilateral: Secondary | ICD-10-CM | POA: Diagnosis not present

## 2021-04-02 DIAGNOSIS — H26493 Other secondary cataract, bilateral: Secondary | ICD-10-CM | POA: Diagnosis not present

## 2021-04-02 DIAGNOSIS — H26491 Other secondary cataract, right eye: Secondary | ICD-10-CM | POA: Diagnosis not present

## 2021-04-02 DIAGNOSIS — H18413 Arcus senilis, bilateral: Secondary | ICD-10-CM | POA: Diagnosis not present

## 2021-04-02 DIAGNOSIS — Z135 Encounter for screening for eye and ear disorders: Secondary | ICD-10-CM | POA: Diagnosis not present

## 2021-04-02 DIAGNOSIS — Z961 Presence of intraocular lens: Secondary | ICD-10-CM | POA: Diagnosis not present

## 2021-04-05 DIAGNOSIS — Z6830 Body mass index (BMI) 30.0-30.9, adult: Secondary | ICD-10-CM | POA: Diagnosis not present

## 2021-04-05 DIAGNOSIS — M25519 Pain in unspecified shoulder: Secondary | ICD-10-CM | POA: Diagnosis not present

## 2021-04-05 DIAGNOSIS — N644 Mastodynia: Secondary | ICD-10-CM | POA: Diagnosis not present

## 2021-04-05 DIAGNOSIS — K2270 Barrett's esophagus without dysplasia: Secondary | ICD-10-CM | POA: Diagnosis not present

## 2021-04-05 DIAGNOSIS — R609 Edema, unspecified: Secondary | ICD-10-CM | POA: Diagnosis not present

## 2021-04-05 DIAGNOSIS — B0229 Other postherpetic nervous system involvement: Secondary | ICD-10-CM | POA: Diagnosis not present

## 2021-04-05 DIAGNOSIS — F419 Anxiety disorder, unspecified: Secondary | ICD-10-CM | POA: Diagnosis not present

## 2021-04-05 DIAGNOSIS — M542 Cervicalgia: Secondary | ICD-10-CM | POA: Diagnosis not present

## 2021-04-05 DIAGNOSIS — I10 Essential (primary) hypertension: Secondary | ICD-10-CM | POA: Diagnosis not present

## 2021-04-05 DIAGNOSIS — E782 Mixed hyperlipidemia: Secondary | ICD-10-CM | POA: Diagnosis not present

## 2021-04-05 DIAGNOSIS — Z23 Encounter for immunization: Secondary | ICD-10-CM | POA: Diagnosis not present

## 2021-04-05 DIAGNOSIS — D509 Iron deficiency anemia, unspecified: Secondary | ICD-10-CM | POA: Diagnosis not present

## 2021-04-20 DIAGNOSIS — H26492 Other secondary cataract, left eye: Secondary | ICD-10-CM | POA: Diagnosis not present

## 2021-04-20 DIAGNOSIS — Z961 Presence of intraocular lens: Secondary | ICD-10-CM | POA: Diagnosis not present

## 2022-07-15 ENCOUNTER — Ambulatory Visit: Payer: PPO | Attending: Cardiovascular Disease | Admitting: Cardiovascular Disease

## 2022-07-15 ENCOUNTER — Ambulatory Visit (INDEPENDENT_AMBULATORY_CARE_PROVIDER_SITE_OTHER): Payer: PPO

## 2022-07-15 ENCOUNTER — Encounter: Payer: Self-pay | Admitting: Cardiovascular Disease

## 2022-07-15 VITALS — BP 120/80 | HR 56 | Ht 64.0 in | Wt 148.0 lb

## 2022-07-15 DIAGNOSIS — R0602 Shortness of breath: Secondary | ICD-10-CM

## 2022-07-15 DIAGNOSIS — R002 Palpitations: Secondary | ICD-10-CM

## 2022-07-15 DIAGNOSIS — E785 Hyperlipidemia, unspecified: Secondary | ICD-10-CM

## 2022-07-15 DIAGNOSIS — I1A0 Resistant hypertension: Secondary | ICD-10-CM | POA: Diagnosis not present

## 2022-07-15 MED ORDER — SPIRONOLACTONE 25 MG PO TABS: 25.0000 mg | ORAL_TABLET | Freq: Every day | ORAL | 0 refills | Status: DC

## 2022-07-15 MED ORDER — DILTIAZEM HCL ER COATED BEADS 120 MG PO CP24: 120.0000 mg | ORAL_CAPSULE | Freq: Every day | ORAL | 0 refills | Status: DC

## 2022-07-15 NOTE — Progress Notes (Signed)
Cardiology Office Note   Date:  07/15/2022   ID:  Cindy, Macdonald May 10, 1941, MRN [id number]  PCP:  Cyndi Bender, PA-C  Cardiologist:   Kathlyn Sacramento, MD   Chief Complaint  Patient presents with   Other    Re-establish care c/o elevated BP. Meds reviewed verbally with pt.      History of Present Illness: Cindy Macdonald is a 82 y.o. female who is here today to establish cardiovascular care regarding refractory hypertension.  She was most recently seen by me in 2019. She has known history of hypertension diagnosed in her early 64s, hyperlipidemia and previous tobacco use.  There is strong family history of hypertension.   She was seen in 2019 for resistant hypertension with negative workup for secondary hypertension.  Metoprolol was switched to carvedilol and spironolactone was added to hydrochlorothiazide.  She had episodes of hypotension after that and spironolactone was discontinued.  Her biggest issue is significant fluctuation of blood pressure.  Her blood pressure can be close to [id number] systolic and it drops quickly with 1 dose of clonidine.  She feels sluggish and reports increased intermittent palpitations and shortness of breath without chest pain. She was placed on torsemide few years ago for lower extremity edema that subsequently resolved.  Past Medical History:  Diagnosis Date   Barrett's esophagus    Benign tumor of parathyroid gland    Colon polyps    COPD (chronic obstructive pulmonary disease) ([facility])    GERD (gastroesophageal reflux disease)    Hx of breast implants, bilateral    Hyperlipidemia    Hypertension     Past Surgical History:  Procedure Laterality Date   COLONOSCOPY  2015   Dr [name]   COLONOSCOPY WITH PROPOFOL N/A 03/06/2019   Procedure: COLONOSCOPY WITH PROPOFOL;  Surgeon: Robert Bellow, MD;  Location: [place] [facility];  Service: Endoscopy;  Laterality: N/A;   ESOPHAGOGASTRODUODENOSCOPY (EGD) WITH PROPOFOL N/A 03/06/2019   Procedure:  ESOPHAGOGASTRODUODENOSCOPY (EGD) WITH PROPOFOL;  Surgeon: Robert Bellow, MD;  Location: [facility] ENDOSCOPY;  Service: Endoscopy;  Laterality: N/A;   HEMORRHOID SURGERY     PARATHYROIDECTOMY     PLACEMENT OF BREAST IMPLANTS     age 46   UPPER GI ENDOSCOPY     dr Vira Agar     Current Outpatient Medications  Medication Sig Dispense Refill   acetaminophen (TYLENOL) 500 MG tablet Take 1,000 mg by mouth every 6 (six) hours as needed for headache (pain).      albuterol (PROVENTIL HFA;VENTOLIN HFA) 108 (90 Base) MCG/ACT inhaler Inhale 2 puffs into the lungs every 6 (six) hours as needed for wheezing or shortness of breath.      carvedilol (COREG) 25 MG tablet Take 25 mg by mouth 2 (two) times daily with a meal.     clonazePAM (KLONOPIN) 0.5 MG tablet Take 0.5 mg by mouth 2 (two) times daily as needed for anxiety.     cloNIDine (CATAPRES) 0.2 MG tablet Take 0.2 mg by mouth as directed. Takes 1 tablet am 1 tablet noon and 2 tablets at bedtime daily.     diltiazem (CARDIZEM CD) 360 MG 24 hr capsule Take 360 mg by mouth daily.     diltiazem (DILACOR XR) 240 MG 24 hr capsule Take 240 mg by mouth daily.     Flaxseed, Linseed, (FLAXSEED OIL) 1000 MG CAPS Take 1,000 mg by mouth at bedtime.     gabapentin (NEURONTIN) 600 MG tablet Take 600 mg by mouth at bedtime.  Magnesium Oxide 250 MG TABS Take 500 mg by mouth at bedtime.     meclizine (ANTIVERT) 25 MG tablet Take 25 mg by mouth 3 (three) times daily as needed for dizziness.     rosuvastatin (CRESTOR) 20 MG tablet Take 20 mg by mouth daily.     torsemide (DEMADEX) 20 MG tablet Take 20 mg by mouth daily.     valsartan (DIOVAN) 320 MG tablet Take 320 mg by mouth daily.     vitamin C (ASCORBIC ACID) 500 MG tablet Take 500 mg by mouth at bedtime.      vitamin E 400 UNIT capsule Take 400 Units by mouth at bedtime.      No current facility-administered medications for this visit.    Allergies:   Codeine, Propoxyphene, Ace inhibitors, and Sulfa  antibiotics    Social History:  The patient  reports that she has never smoked. She has never used smokeless tobacco. She reports that she does not drink alcohol and does not use drugs.   Family History:  The patient's family history includes Brain cancer in her brother; Cancer in her brother; Leukemia in her sister; Lung cancer in her father and paternal grandfather; Lymphoma in her brother and sister.    ROS:  Please see the history of present illness.   Otherwise, review of systems are positive for none.   All other systems are reviewed and negative.    PHYSICAL EXAM: VS:  BP 120/80 (BP Location: Right Arm, Patient Position: Sitting, Cuff Size: Normal)   Pulse (!) 56   Ht '5\' 4"'$  (1.626 m)   Wt 148 lb (67.1 kg)   SpO2 96%   BMI 25.40 kg/m  , BMI Body mass index is 25.4 kg/m. GEN: Well nourished, well developed, in no acute distress  HEENT: normal  Neck: no JVD, carotid bruits, or masses Cardiac: RRR; no  rubs, or gallops,no edema .  1 out of 6 systolic murmur in the aortic area Respiratory:  clear to auscultation bilaterally, normal work of breathing GI: soft, nontender, nondistended, + BS MS: no deformity or atrophy  Skin: warm and dry, no rash Neuro:  Strength and sensation are intact Psych: euthymic mood, full affect   EKG:  EKG is ordered today. 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   Recent Labs: No results found for requested labs within last 365 days.    Lipid Panel No results found for: "CHOL", "TRIG", "HDL", "CHOLHDL", "VLDL", "LDLCALC", "LDLDIRECT"    Wt Readings from Last 3 Encounters:  07/15/22 148 lb (67.1 kg)  03/06/19 175 lb (79.4 kg)  02/06/18 151 lb 12.8 oz (68.9 kg)           07/15/2022    9:39 AM 11/17/2017   10:19 AM  PAD Screen  Previous PAD dx? No No  Previous surgical procedure? No No  Pain with walking? No No  Feet/toe relief with dangling? No No  Painful, non-healing ulcers? No No  Extremities discolored? No No       ASSESSMENT AND PLAN:  1.  Refractory hypertension: Her workup for secondary hypertension was negative in 2019.  I do not think clonidine is a good option at her 82 group and is likely responsible for some of her fatigue and significant fluctuation in blood pressure.  In addition, she is mildly bradycardic which is likely medication induced. Based on this, I elected to make the following changes in her medications: Stop clonidine Decrease diltiazem extended release to 120 mg once  daily with plans to transition to amlodipine if heart rate remains stable. Stop torsemide and add spironolactone 25 mg once daily for better blood pressure control. Check basic metabolic profile in 1 week.   2.  Exertional dyspnea: I requested an echocardiogram.  No chest pain.  3. Hyperlipidemia: Currently on rosuvastatin.  4.  Palpitations: I requested a 2 weeks ZIO monitor.    Disposition:   FU in 6 weeks.  Signed,  Kathlyn Sacramento, MD  07/15/2022 9:57 AM    [facility]

## 2022-07-15 NOTE — Patient Instructions (Signed)
Medication Instructions:  STOP the Clonidine STOP the Torsemide  DECREASE the Diltiazem to 120 mg once daily  START Spironolactone 25 mg once daily  *If you need a refill on your cardiac medications before your next appointment, please call your pharmacy*   Lab Work: Your provider would like for you to return in one week to have the following labs drawn: BMET.   Please go to the [facility] entrance and check in at the front desk.  You do not need an appointment.  They are open from 7am-6 pm.  You will not need to be fasting.  If you have labs (blood work) drawn today and your tests are completely normal, you will receive your results only by: [place] (if you have MyChart) OR A paper copy in the mail If you have any lab test that is abnormal or we need to change your treatment, we will call you to review the results.   Testing/Procedures: Your physician has requested that you have an echocardiogram. Echocardiography is a painless test that uses sound waves to create images of your heart. It provides your doctor with information about the size and shape of your heart and how well your heart's chambers and valves are working.   You may receive an ultrasound enhancing agent through an IV if needed to better visualize your heart during the echo. This procedure takes approximately one hour.  There are no restrictions for this procedure.  This will take place at [place] [address], [place]   Follow-Up: At [facility], you and your health needs are our priority.  As part of our continuing mission to provide you with exceptional heart care, we have created designated Provider Care Teams.  These Care Teams include your primary Cardiologist (physician) and Advanced Practice Providers (APPs -  Physician Assistants and Nurse Practitioners) who all work together to provide you with the care you need, when you need it.  We  recommend signing up for the patient portal called "MyChart".  Sign up information is provided on this After Visit Summary.  MyChart is used to connect with patients for Virtual Visits (Telemedicine).  Patients are able to view lab/test results, encounter notes, upcoming appointments, etc.  Non-urgent messages can be sent to your provider as well.   To learn more about what you can do with MyChart, go to [email or web address].    Your next appointment:   6 week(s)  Provider:   You may see Dr. Fletcher Anon or one of the following Advanced Practice Providers on your designated Care Team:   Murray Hodgkins, NP Christell Faith, PA-C Cadence Kathlen Mody, PA-C Gerrie Nordmann, NP    Other Instructions [place] Monitor Instructions  Your physician has requested you wear a ZIO patch monitor for 14 days.  This is a single patch monitor. [employer] supplies one patch monitor per enrollment. Additional stickers are not available. Please do not apply patch if you will be having a Nuclear Stress Test,  Echocardiogram, Cardiac CT, MRI, or Chest Xray during the period you would be wearing the  monitor. The patch cannot be worn during these tests. You cannot remove and re-apply the  ZIO XT patch monitor.  Your ZIO patch monitor will be mailed 3 day USPS to your address on file. It may take 3-5 days  to receive your monitor after you have been enrolled.  Once you have received your monitor, please review the enclosed instructions. Your monitor  has already been registered assigning a specific monitor serial # to you.  Billing and Patient Assistance Program Information  We have supplied [employer] with any of your insurance information on file for billing purposes. [employer] offers a sliding scale Patient Assistance Program for patients that do not have  insurance, or whose insurance does not completely cover the cost of the ZIO monitor.  You must apply for the Patient Assistance Program to qualify for this discounted  rate.  To apply, please call [employer] at [phone], select option 4, select option 2, ask to apply for  Patient Assistance Program. Theodore Demark will ask your household income, and how many people  are in your household. They will quote your out-of-pocket cost based on that information.  [employer] will also be able to set up a 50-month interest-free payment plan if needed.  Applying the monitor   Shave hair from upper left chest.  Hold abrader disc by orange tab. Rub abrader in 40 strokes over the upper left chest as  indicated in your monitor instructions.  Clean area with 4 enclosed alcohol pads. Let dry.  Apply patch as indicated in monitor instructions. Patch will be placed under collarbone on left  side of chest with arrow pointing upward.  Rub patch adhesive wings for 2 minutes. Remove white label marked "1". Remove the white  label marked "2". Rub patch adhesive wings for 2 additional minutes.  While looking in a mirror, press and release button in center of patch. A small green light will  flash 3-4 times. This will be your only indicator that the monitor has been turned on.  Do not shower for the first 24 hours. You may shower after the first 24 hours.  Press the button if you feel a symptom. You will hear a small click. Record Date, Time and  Symptom in the Patient Logbook.  When you are ready to remove the patch, follow instructions on the last 2 pages of Patient  Logbook. Stick patch monitor onto the last page of Patient Logbook.  Place Patient Logbook in the blue and white box. Use locking tab on box and tape box closed  securely. The blue and white box has prepaid postage on it. Please place it in the mailbox as  soon as possible. Your physician should have your test results approximately 7 days after the  monitor has been [facility]  Call [facility] [phone] you have questions regarding  your ZIO XT patch monitor. Call them  immediately if you see an orange light blinking on your  monitor.  If your monitor falls off in less than 4 days, contact our Monitor department at [phone]  If your monitor becomes loose or falls off after 4 days call [employer] at [phone]  suggestions on securing your monitor

## 2022-07-18 ENCOUNTER — Telehealth: Payer: Self-pay | Admitting: Cardiovascular Disease

## 2022-07-18 MED ORDER — SPIRONOLACTONE 25 MG PO TABS: 25.0000 mg | ORAL_TABLET | Freq: Every day | ORAL | 0 refills | Status: DC

## 2022-07-18 NOTE — Telephone Encounter (Signed)
*  STAT* If patient is at the pharmacy, call can be transferred to refill team.   1. Which medications need to be refilled? (please list name of each medication and dose if known) spironolactone (ALDACTONE) 25 MG tablet   2. Which pharmacy/location (including street and city if local pharmacy) is medication to be sent to?  [id number] [place], [place] - [address]    3. Do they need a 30 day or 90 day supply? [address]

## 2022-07-18 NOTE — Telephone Encounter (Signed)
Requested Prescriptions   Signed Prescriptions Disp Refills   spironolactone (ALDACTONE) 25 MG tablet 90 tablet 0    Sig: Take 1 tablet (25 mg total) by mouth daily.    Authorizing Provider: Kathlyn Sacramento A    Ordering User: Raelene Bott, Sharonann Malbrough L

## 2022-07-20 DIAGNOSIS — R002 Palpitations: Secondary | ICD-10-CM | POA: Diagnosis not present

## 2022-07-22 ENCOUNTER — Other Ambulatory Visit
Admission: RE | Admit: 2022-07-22 | Discharge: 2022-07-22 | Disposition: A | Discharge status: Home / Self Care | Payer: PPO | Source: Ambulatory Visit | Attending: Cardiovascular Disease | Admitting: Cardiovascular Disease

## 2022-07-22 DIAGNOSIS — R0602 Shortness of breath: Secondary | ICD-10-CM | POA: Diagnosis present

## 2022-07-22 LAB — BASIC METABOLIC PANEL
Anion gap: 8 (ref 5–15)
BUN: 16 mg/dL (ref 8–23)
CO2: 27 mmol/L (ref 22–32)
Calcium: 9 mg/dL (ref 8.9–10.3)
Chloride: 103 mmol/L (ref 98–111)
Creatinine, Ser: 0.8 mg/dL (ref 0.44–1.00)
GFR, Estimated: >60 mL/min (ref >60)
Glucose, Bld: 123 mg/dL — ABNORMAL HIGH (ref 70–99)
Potassium: 3.9 mmol/L (ref 3.5–5.1)
Sodium: 138 mmol/L (ref 135–145)

## 2022-08-26 ENCOUNTER — Ambulatory Visit: Payer: PPO | Attending: Cardiovascular Disease

## 2022-08-26 DIAGNOSIS — R0602 Shortness of breath: Secondary | ICD-10-CM | POA: Diagnosis not present

## 2022-08-26 LAB — ECHOCARDIOGRAM COMPLETE
AR max vel: 1.88 cm2
AV Area VTI: 1.95 cm2
AV Area mean vel: 1.9 cm2
AV Mean grad: 6.6 mmHg
AV Peak grad: 12.4 mmHg
Ao pk vel: 1.76 m/s
Area-P 1/2: 3.17 cm2
Calc EF: 55.3 %
P 1/2 time: 563 msec
S' Lateral: 2.6 cm
Single Plane A2C EF: 55.3 %
Single Plane A4C EF: 57.9 %

## 2022-08-26 NOTE — Progress Notes (Signed)
Cardiology Office Note    Date:  08/29/2022   ID:  Cindy Macdonald, DOB [birth year], MRN [id number]  PCP:  Lonie Peak, PA-C  Cardiologist:  Lorine Bears, MD  Electrophysiologist:  None   Chief Complaint: Follow-up  History of Present Illness:   Cindy Macdonald is a 82 y.o. 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  She was seen in 2019 for resistant hypertension with negative secondary hypertension workup.  At that time metoprolol was transitioned to carvedilol and spironolactone was added to HCTZ.  She had episodes of hypotension following this meeting spironolactone to be discontinued.  She reestablished care in 06/2022 noting significant fluctuation of blood pressure readings can be close to 200 significant drop following 1 dose of clonidine.  She reported feeling sluggish and noted intermittent palpitations with shortness of breath without frank chest pain.  Blood pressure in the office was 120/80 with heart rate of 56 bpm.  It was felt clonidine was likely contributing to her fatigue and significant fluctuation in blood pressure.  This medication was discontinued.  Diltiazem was decreased to 120 mg daily with transition to amlodipine in the future if heart rate remains stable.  Torsemide was discontinued.  Spironolactone 25 mg was added.  Zio patch showed a predominant rhythm of sinus with an average rate of 68 bpm (range 53 to 164 bpm), 27 episodes of SVT with the fastest interval lasting 5 beats and the longest interval lasting 15 beats.  Rare PACs and PVCs.  Most triggered events did not correlate with arrhythmia.  Echo in 08/2022 showed an EF of 60 to 65%, severe asymmetric septal hypertrophy with septal wall measuring 1.7 cm with no LVOT obstruction, normal wall motion, grade 1 diastolic dysfunction, normal RV systolic function with mildly enlarged  ventricular cavity size, mildly elevated PASP, moderate biatrial enlargement, mild to moderate aortic insufficiency, aortic valve sclerosis without evidence of stenosis, and an estimated right atrial pressure of 8 mmHg.  She comes in accompanied by her sister today and is doing well from a cardiac perspective.  She is without symptoms of angina or cardiac decompensation.  She does continue to note some ongoing fatigue.  Since making the above medication changes, she has had less fluctuation in her blood pressure readings.  Home BP readings are typically in the 130s over 80s, occasionally a little bit higher.  No further readings around 200 systolic.  She is tolerating cardiac medications without issues.  No history of syncope.  No falls or symptoms concerning for bleeding.   Labs independently reviewed: 07/2022 - potassium 3.9, BUN 16, serum creatinine 0.8 01/2019 - Hgb 10.0, PLT 302, albumin 3.8, AST/ALT normal  Past Medical History:  Diagnosis Date   Barrett's esophagus    Benign tumor of parathyroid gland    Colon polyps    COPD (chronic obstructive pulmonary disease)    GERD (gastroesophageal reflux disease)    Hx of breast implants, bilateral    Hyperlipidemia    Hypertension     Past Surgical History:  Procedure Laterality Date   COLONOSCOPY  2015   Dr Markham Jordan   COLONOSCOPY WITH PROPOFOL N/A 03/06/2019   Procedure: COLONOSCOPY WITH PROPOFOL;  Surgeon: Earline Mayotte, MD;  Location: [facility] ENDOSCOPY;  Service: Endoscopy;  Laterality: N/A;   ESOPHAGOGASTRODUODENOSCOPY (EGD) WITH PROPOFOL N/A 03/06/2019   Procedure: ESOPHAGOGASTRODUODENOSCOPY (EGD) WITH PROPOFOL;  Surgeon: Lemar Livings,  Merrily Pew, MD;  Location: [facility] ENDOSCOPY;  Service: Endoscopy;  Laterality: N/A;   HEMORRHOID SURGERY     PARATHYROIDECTOMY     PLACEMENT OF BREAST IMPLANTS     age 89   UPPER GI ENDOSCOPY     dr Mechele Collin    Current Medications: Current Meds  Medication Sig   acetaminophen (TYLENOL) 500 MG tablet  Take 1,000 mg by mouth every 6 (six) hours as needed for headache (pain).    albuterol (PROVENTIL HFA;VENTOLIN HFA) 108 (90 Base) MCG/ACT inhaler Inhale 2 puffs into the lungs every 6 (six) hours as needed for wheezing or shortness of breath.    amLODipine (NORVASC) 5 MG tablet Take 1 tablet (5 mg total) by mouth daily.   carvedilol (COREG) 25 MG tablet Take 25 mg by mouth 2 (two) times daily with a meal.   clonazePAM (KLONOPIN) 0.5 MG tablet Take 0.5 mg by mouth 2 (two) times daily as needed for anxiety.   cyclobenzaprine (FLEXERIL) 5 MG tablet Take 5 mg by mouth at bedtime as needed.   Flaxseed, Linseed, (FLAXSEED OIL) 1000 MG CAPS Take 1,000 mg by mouth at bedtime.   gabapentin (NEURONTIN) 600 MG tablet Take 600 mg by mouth at bedtime.   lansoprazole (PREVACID) 30 MG capsule Take 30 mg by mouth daily.   Magnesium Oxide 250 MG TABS Take 500 mg by mouth at bedtime.   meclizine (ANTIVERT) 25 MG tablet Take 25 mg by mouth 3 (three) times daily as needed for dizziness.   rosuvastatin (CRESTOR) 20 MG tablet Take 20 mg by mouth daily.   spironolactone (ALDACTONE) 25 MG tablet Take 1 tablet (25 mg total) by mouth daily.   valsartan (DIOVAN) 320 MG tablet Take 320 mg by mouth daily.   vitamin C (ASCORBIC ACID) 500 MG tablet Take 500 mg by mouth at bedtime.    vitamin E 400 UNIT capsule Take 400 Units by mouth at bedtime.    [DISCONTINUED] amLODipine (NORVASC) 5 MG tablet Take 5 mg by mouth daily.   [DISCONTINUED] diltiazem (CARDIZEM CD) 120 MG 24 hr capsule Take 1 capsule (120 mg total) by mouth daily.    Allergies:   Codeine, Propoxyphene, Ace inhibitors, and Sulfa antibiotics   Social History   Socioeconomic History   Marital status: Widowed    Spouse name: Not on file   Number of children: Not on file   Years of education: Not on file   Highest education level: Not on file  Occupational History   Not on file  Tobacco Use   Smoking status: Never   Smokeless tobacco: Never  Substance  and Sexual Activity   Alcohol use: No   Drug use: No   Sexual activity: Not on file  Other Topics Concern   Not on file  Social History Narrative   Not on file   Social Determinants of Health   Financial Resource Strain: Not on file  Food Insecurity: Not on file  Transportation Needs: Not on file  Physical Activity: Not on file  Stress: Not on file  Social Connections: Not on file     Family History:  The patient's family history includes Brain cancer in her brother; Cancer in her brother; Leukemia in her sister; Lung cancer in her father and paternal grandfather; Lymphoma in her brother and sister.  ROS:   12-point review of systems is negative unless otherwise noted in the HPI.   EKGs/Labs/Other Studies Reviewed:    Studies reviewed were summarized above. The additional studies  were reviewed today:  2D echo 08/26/2022: 1. Severe asymmetric septal hypertrophy, septal wall measuring 1.7 cm. no  LVOT obstruction. consider CMR for HCM eval.. Left ventricular ejection  fraction, by estimation, is 60 to 65%. The left ventricle has normal  function. The left ventricle has no  regional wall motion abnormalities. There is severe asymmetric left  ventricular hypertrophy of the septal segment. Left ventricular diastolic  parameters are consistent with Grade I diastolic dysfunction (impaired  relaxation).   2. Right ventricular systolic function is normal. The right ventricular  size is mildly enlarged. There is mildly elevated pulmonary artery  systolic pressure.   3. Left atrial size was moderately dilated.   4. Right atrial size was moderately dilated.   5. The mitral valve is degenerative. No evidence of mitral valve  regurgitation.   6. The aortic valve is tricuspid. Aortic valve regurgitation is mild to  moderate. Aortic valve sclerosis/calcification is present, without any  evidence of aortic stenosis.   7. The inferior vena cava is dilated in size with >50% respiratory   variability, suggesting right atrial pressure of 8 mmHg.  __________  Cindy Macdonald patch 06/2022: Patient had a min HR of 53 bpm, max HR of 164 bpm, and avg HR of 68 bpm. Predominant underlying rhythm was Sinus Rhythm.  27 Supraventricular Tachycardia runs occurred, the run with the fastest interval lasting 5 beats with a max rate of 164 bpm, the longest lasting 15 beats with an avg rate of 110 bpm.  Rare PACs and rare PVCs. Most triggered events did not correlate with arrhythmia. __________  2D echo 11/30/2017: - Left ventricle: The cavity size was normal. Systolic function was    normal. The estimated ejection fraction was in the range of 60%    to 65%. Wall motion was normal; there were no regional wall    motion abnormalities. Doppler parameters are consistent with    abnormal left ventricular relaxation (grade 1 diastolic    dysfunction).  - Aortic valve: There was mild regurgitation.  - Left atrium: The atrium was normal in size.  - Right ventricle: Systolic function was normal.  - Pulmonary arteries: Systolic pressure was within the normal    range.    EKG:  EKG is not ordered today.    Recent Labs: 07/22/2022: BUN 16; Creatinine, Ser 0.80; Potassium 3.9; Sodium 138  Recent Lipid Panel No results found for: "CHOL", "TRIG", "HDL", "CHOLHDL", "VLDL", "LDLCALC", "LDLDIRECT"  PHYSICAL EXAM:    VS:  BP 134/80   Pulse (!) 58   Ht 5\' 4"  (1.626 m)   Wt 149 lb 9.6 oz (67.9 kg)   SpO2 99%   BMI 25.68 kg/m   BMI: Body mass index is 25.68 kg/m.  Physical Exam Vitals reviewed.  Constitutional:      Appearance: She is well-developed.  HENT:     Head: Normocephalic and atraumatic.  Eyes:     General:        Right eye: No discharge.        Left eye: No discharge.  Neck:     Vascular: No JVD.  Cardiovascular:     Rate and Rhythm: Normal rate and regular rhythm.     Heart sounds: S1 normal and S2 normal. Heart sounds not distant. No midsystolic click and no opening snap. Murmur  heard.     Systolic murmur is present with a grade of 1/6 at the upper right sternal border.     No friction rub.  Pulmonary:  Effort: Pulmonary effort is normal. No respiratory distress.     Breath sounds: Normal breath sounds. No decreased breath sounds, wheezing or rales.  Chest:     Chest wall: No tenderness.  Abdominal:     General: There is no distension.  Musculoskeletal:     Cervical back: Normal range of motion.     Right lower leg: No edema.     Left lower leg: No edema.  Skin:    General: Skin is warm and dry.     Nails: There is no clubbing.  Neurological:     Mental Status: She is alert and oriented to person, place, and time.  Psychiatric:        Speech: Speech normal.        Behavior: Behavior normal.        Thought Content: Thought content normal.        Judgment: Judgment normal.     Wt Readings from Last 3 Encounters:  08/29/22 149 lb 9.6 oz (67.9 kg)  07/15/22 148 lb (67.1 kg)  03/06/19 175 lb (79.4 kg)     ASSESSMENT & PLAN:   Refractory hypertension: Blood pressure is well-controlled in the office and better controlled at home with less fluctuation.  Stop diltiazem.  Start amlodipine 5 mg daily with recommendation to titrate as indicated.  Continue spironolactone 25 mg daily, carvedilol 25 mg twice daily, and valsartan 320 mg daily.  Recent BMP showed stable renal function and electrolytes.  Likely contributing to LVH as outlined below.  LVH: Echo demonstrated severe asymmetric septal hypertrophy with a septal wall measuring 1.7 cm without LVOT obstruction.  Schedule cardiac MRI to evaluate for HCM.  PSVT: Noted on recent outpatient cardiac monitoring.  No sustained episodes.  Remains on carvedilol.  Hyperlipidemia: She remains on rosuvastatin.    Disposition: F/u with Dr. Kirke Corin or an APP in 2 months.   Medication Adjustments/Labs and Tests Ordered: Current medicines are reviewed at length with the patient today.  Concerns regarding medicines  are outlined above. Medication changes, Labs and Tests ordered today are summarized above and listed in the Patient Instructions accessible in Encounters.   Signed, Eula Listen, PA-C 08/29/2022 11:44 AM     [facility] [place] [address] [place], [place] [address] [phone]

## 2022-08-29 ENCOUNTER — Ambulatory Visit: Payer: PPO | Attending: Physician Assistant | Admitting: Physician Assistant

## 2022-08-29 ENCOUNTER — Other Ambulatory Visit
Admission: RE | Admit: 2022-08-29 | Discharge: 2022-08-29 | Disposition: A | Discharge status: Home / Self Care | Payer: PPO | Source: Ambulatory Visit | Attending: Physician Assistant | Admitting: Physician Assistant

## 2022-08-29 ENCOUNTER — Encounter: Payer: Self-pay | Admitting: Physician Assistant

## 2022-08-29 VITALS — BP 134/80 | HR 58 | Ht 64.0 in | Wt 149.6 lb

## 2022-08-29 DIAGNOSIS — I471 Supraventricular tachycardia, unspecified: Secondary | ICD-10-CM | POA: Insufficient documentation

## 2022-08-29 DIAGNOSIS — E785 Hyperlipidemia, unspecified: Secondary | ICD-10-CM | POA: Diagnosis not present

## 2022-08-29 DIAGNOSIS — I517 Cardiomegaly: Secondary | ICD-10-CM | POA: Diagnosis not present

## 2022-08-29 DIAGNOSIS — I1A0 Resistant hypertension: Secondary | ICD-10-CM | POA: Insufficient documentation

## 2022-08-29 LAB — CBC
HCT: 39.4 % (ref 36.0–46.0)
Hemoglobin: 12.6 g/dL (ref 12.0–15.0)
MCH: 29.4 pg (ref 26.0–34.0)
MCHC: 32 g/dL (ref 30.0–36.0)
MCV: 91.8 fL (ref 80.0–100.0)
Platelets: 239 10*3/uL (ref 150–400)
RBC: 4.29 MIL/uL (ref 3.87–5.11)
RDW: 14.6 % (ref 11.5–15.5)
WBC: 4.2 10*3/uL (ref 4.0–10.5)
nRBC: 0 % (ref 0.0–0.2)

## 2022-08-29 LAB — BASIC METABOLIC PANEL
Anion gap: 9 (ref 5–15)
BUN: 23 mg/dL (ref 8–23)
CO2: 27 mmol/L (ref 22–32)
Calcium: 9 mg/dL (ref 8.9–10.3)
Chloride: 103 mmol/L (ref 98–111)
Creatinine, Ser: 0.96 mg/dL (ref 0.44–1.00)
GFR, Estimated: 59 mL/min — ABNORMAL LOW (ref >60)
Glucose, Bld: 104 mg/dL — ABNORMAL HIGH (ref 70–99)
Potassium: 4.6 mmol/L (ref 3.5–5.1)
Sodium: 139 mmol/L (ref 135–145)

## 2022-08-29 MED ORDER — AMLODIPINE BESYLATE 5 MG PO TABS: 5.0000 mg | ORAL_TABLET | Freq: Every day | ORAL | 3 refills | Status: DC

## 2022-08-29 NOTE — Patient Instructions (Addendum)
Medication Instructions:  Your physician has recommended you make the following change in your medication:   STOP Cardizem START Amlodipine 5 mg once daily    *If you need a refill on your cardiac medications before your next appointment, please call your pharmacy*   Lab Work: CBC & BMP today over at the [facility] and check in at registration desk.   If you have labs (blood work) drawn today and your tests are completely normal, you will receive your results only by: MyChart Message (if you have MyChart) OR A paper copy in the mail If you have any lab test that is abnormal or we need to change your treatment, we will call you to review the results.   Testing/Procedures:   You are scheduled for Cardiac MRI on ______________. Please arrive for your appointment at ______________ ( arrive 30-45 minutes prior to test start time). ?  [facility] [address] [place], [place] [address] [phone] Please take advantage of the free valet parking available at the MAIN entrance (A entrance).  Proceed to the [facility] Radiology Department (First Floor) for check-in.   OR   [facility] [address] [place], [place] [address] [phone] Please take advantage of the free valet parking available at the MAIN entrance. Proceed to [facility] registration for check-in (first floor).  Magnetic resonance imaging (MRI) is a painless test that produces images of the inside of the body without using Xrays.  During an MRI, strong magnets and radio waves work together in a Data processing manager to form detailed images.   MRI images may provide more details about a medical condition than X-rays, CT scans, and ultrasounds can provide.  You may be given earphones to listen for instructions.  You may eat a light breakfast and take medications as ordered with the exception of furosemide, hydrochlorothiazide, or spironolactone(fluid pill, other). Please avoid  stimulants for 12 hr prior to test. (Ie. Caffeine, nicotine, chocolate, or antihistamine medications)  If a contrast material will be used, an IV will be inserted into one of your veins. Contrast material will be injected into your IV. It will leave your body through your urine within a day. You may be told to drink plenty of fluids to help flush the contrast material out of your system.  You will be asked to remove all metal, including: Watch, jewelry, and other metal objects including hearing aids, hair pieces and dentures. Also wearable glucose monitoring systems (ie. Freestyle Libre and Omnipods) (Braces and fillings normally are not a problem.)   TEST WILL TAKE APPROXIMATELY 1 HOUR  PLEASE NOTIFY SCHEDULING AT LEAST 24 HOURS IN ADVANCE IF YOU ARE UNABLE TO KEEP YOUR APPOINTMENT. [phone]  Please call Rockwell Alexandria, cardiac imaging nurse navigator with any questions/concerns. Rockwell Alexandria RN Navigator Cardiac Imaging Larey Brick RN Navigator Cardiac Imaging [facility] [phone] Office     Follow-Up: At [facility], you and your health needs are our priority.  As part of our continuing mission to provide you with exceptional heart care, we have created designated Provider Care Teams.  These Care Teams include your primary Cardiologist (physician) and Advanced Practice Providers (APPs -  Physician Assistants and Nurse Practitioners) who all work together to provide you with the care you need, when you need it.  Your next appointment:   2 month(s)  Provider:   Lorine Bears, MD or Eula Listen, PA-C

## 2022-09-06 ENCOUNTER — Telehealth (HOSPITAL_COMMUNITY): Payer: Self-pay | Admitting: *Deleted

## 2022-09-06 NOTE — Telephone Encounter (Signed)
Reaching out to patient to offer assistance regarding upcoming cardiac imaging study; pt verbalizes understanding of appt date/time, parking situation and where to check in, and verified current allergies; name and call back number provided for further questions should they arise  Larey Brick RN Navigator Cardiac Imaging [facility] [phone] office [phone] cell  Patient had MRI in June without incident.

## 2022-09-07 ENCOUNTER — Other Ambulatory Visit: Payer: Self-pay | Admitting: Physician Assistant

## 2022-09-07 ENCOUNTER — Ambulatory Visit
Admission: RE | Admit: 2022-09-07 | Discharge: 2022-09-07 | Disposition: A | Discharge status: Home / Self Care | Payer: PPO | Source: Ambulatory Visit | Attending: Physician Assistant | Admitting: Physician Assistant

## 2022-09-07 DIAGNOSIS — I471 Supraventricular tachycardia, unspecified: Secondary | ICD-10-CM

## 2022-09-07 DIAGNOSIS — I1A0 Resistant hypertension: Secondary | ICD-10-CM

## 2022-09-07 DIAGNOSIS — I517 Cardiomegaly: Secondary | ICD-10-CM | POA: Diagnosis present

## 2022-09-07 DIAGNOSIS — E785 Hyperlipidemia, unspecified: Secondary | ICD-10-CM | POA: Insufficient documentation

## 2022-09-07 DIAGNOSIS — I351 Nonrheumatic aortic (valve) insufficiency: Secondary | ICD-10-CM

## 2022-09-07 MED ORDER — GADOBUTROL 1 MMOL/ML IV SOLN
10.0000 mL | Freq: Once | INTRAVENOUS | Status: AC | PRN
  Administered 2022-09-07: 10 mL via INTRAVENOUS

## 2022-09-08 ENCOUNTER — Telehealth: Payer: Self-pay | Admitting: *Deleted

## 2022-09-08 DIAGNOSIS — I517 Cardiomegaly: Secondary | ICD-10-CM

## 2022-09-08 NOTE — Telephone Encounter (Signed)
Spoke with patient and reviewed results and recommendations. Advised that someone would reach out to her in order to schedule that appointment. She verbalized understanding of our conversation.

## 2022-09-08 NOTE — Telephone Encounter (Signed)
-----   Message from Sondra Barges, PA-C sent at 09/08/2022  2:32 PM EDT ----- Cardiac MRI showed no thickening of the septal wall of the heart without evidence of obstruction.  No high risk features noted in history or cardiac MRI.  Please refer the patient to HCM clinic with Dr. Izora Ribas.  Recent Zio patch showed no significant ventricular ectopy (rare PVCs). First degree relatives should also be screened, this may be able to be completed in the HCM clinic.

## 2022-09-21 ENCOUNTER — Other Ambulatory Visit: Payer: PPO

## 2022-10-13 ENCOUNTER — Other Ambulatory Visit: Payer: Self-pay | Admitting: Cardiovascular Disease

## 2022-10-17 ENCOUNTER — Other Ambulatory Visit: Payer: Self-pay | Admitting: Cardiovascular Disease

## 2022-11-04 ENCOUNTER — Ambulatory Visit: Payer: PPO | Admitting: Physician Assistant

## 2022-11-09 ENCOUNTER — Ambulatory Visit: Payer: PPO | Admitting: Genetic Counselor

## 2022-11-09 ENCOUNTER — Ambulatory Visit: Payer: PPO | Attending: Internal Medicine | Admitting: Internal Medicine

## 2022-11-09 ENCOUNTER — Encounter: Payer: Self-pay | Admitting: Internal Medicine

## 2022-11-09 ENCOUNTER — Ambulatory Visit: Payer: PPO | Attending: Genetic Counselor | Admitting: Genetic Counselor

## 2022-11-09 VITALS — BP 104/70 | HR 57 | Ht 64.0 in | Wt 156.0 lb

## 2022-11-09 DIAGNOSIS — I517 Cardiomegaly: Secondary | ICD-10-CM | POA: Insufficient documentation

## 2022-11-09 DIAGNOSIS — I1A0 Resistant hypertension: Secondary | ICD-10-CM | POA: Diagnosis not present

## 2022-11-09 DIAGNOSIS — I471 Supraventricular tachycardia, unspecified: Secondary | ICD-10-CM

## 2022-11-09 DIAGNOSIS — I351 Nonrheumatic aortic (valve) insufficiency: Secondary | ICD-10-CM | POA: Insufficient documentation

## 2022-11-09 DIAGNOSIS — I422 Other hypertrophic cardiomyopathy: Secondary | ICD-10-CM | POA: Diagnosis not present

## 2022-11-09 NOTE — Patient Instructions (Signed)
Medication Instructions:  Your physician recommends that you continue on your current medications as directed. Please refer to the Current Medication list given to you today.  *If you need a refill on your cardiac medications before your next appointment, please call your pharmacy*   Lab Work: NONE If you have labs (blood work) drawn today and your tests are completely normal, you will receive your results only by: MyChart Message (if you have MyChart) OR A paper copy in the mail If you have any lab test that is abnormal or we need to change your treatment, we will call you to review the results.   Testing/Procedures: Your physician has requested that you have an exercise tolerance test. For further information please visit [email or web address] Please also follow instruction sheet, as given.    Follow-Up: At [facility], you and your health needs are our priority.  As part of our continuing mission to provide you with exceptional heart care, we have created designated Provider Care Teams.  These Care Teams include your primary Cardiologist (physician) and Advanced Practice Providers (APPs -  Physician Assistants and Nurse Practitioners) who all work together to provide you with the care you need, when you need it.   Your next appointment:   1 year(s)  Provider:   Riley Lam, MD

## 2022-11-09 NOTE — Progress Notes (Signed)
Cardiology Office Note:    Date:  11/09/2022   ID:  Cindy Macdonald, DOB 04-10-1941, MRN [id number]  PCP:  Lonie Peak, PA-C   [facility] Providers Cardiologist:  Lorine Bears, MD     Referring MD: Sondra Barges, PA-C   CC: HCM eval  Consulted for the evaluation of asymetric septal hypertrophy at the behest of [employer] PA-C  History of Present Illness:    Cindy Macdonald is a 82 y.o. female with a hx of HTN (refractory diagnosed in her 30s) Paroxsymal SVT, Prior tobacco abuse and COPD.  In 2024 she was found to have asymmetric septal hypertrophy (septal thickness 16 mm on CMR, 17 mm on echo, Chordal SAM only, prominent eustachain valve, no significant LGE, Mild AI , and decreased septal strain on echo.  Notes that she has some fatigue but is otherwise well. Notes that her blood pressure has been coming down and she feel more fatigued. She has high blood pressure since her early 30s.  It been difficult to control She has had intermittent control over the years.    Patient notes SOB at rest and no DOE. Does all of her house work and yard work with no symptoms.  Notes no palpitations Notes no CP. Notes no Dizziness. Notes no syncope. She still works as a Producer, television/film/video  Notable family events include  - mother died of a heart attack at the age of 42, had MR; saw Dr. Eden Emms - father had lung cancer and heart attack 59 - one brother and two sisters who are living; no history of HCM  - one brother died in car accident at the age of 59; the etiology of the wreck is unclear - one brother died from brain cancer - one brother unclear etiology, died around age 81 - three daugthers- no screenings no diagnosis, all three are living and doing ok; youngest is here today; middle had kidney cancer   Past Medical History:  Diagnosis Date   Barrett's esophagus    Benign tumor of parathyroid gland    Colon polyps    COPD (chronic obstructive pulmonary disease) (HCC)    GERD  (gastroesophageal reflux disease)    Hx of breast implants, bilateral    Hyperlipidemia    Hypertension     Past Surgical History:  Procedure Laterality Date   COLONOSCOPY  2015   Dr Markham Jordan   COLONOSCOPY WITH PROPOFOL N/A 03/06/2019   Procedure: COLONOSCOPY WITH PROPOFOL;  Surgeon: Earline Mayotte, MD;  Location: [facility] ENDOSCOPY;  Service: Endoscopy;  Laterality: N/A;   ESOPHAGOGASTRODUODENOSCOPY (EGD) WITH PROPOFOL N/A 03/06/2019   Procedure: ESOPHAGOGASTRODUODENOSCOPY (EGD) WITH PROPOFOL;  Surgeon: Earline Mayotte, MD;  Location: [facility] ENDOSCOPY;  Service: Endoscopy;  Laterality: N/A;   HEMORRHOID SURGERY     PARATHYROIDECTOMY     PLACEMENT OF BREAST IMPLANTS     age 72   UPPER GI ENDOSCOPY     dr Mechele Collin    Current Medications: Current Meds  Medication Sig   acetaminophen (TYLENOL) 500 MG tablet Take 1,000 mg by mouth every 6 (six) hours as needed for headache (pain).    albuterol (PROVENTIL HFA;VENTOLIN HFA) 108 (90 Base) MCG/ACT inhaler Inhale 2 puffs into the lungs every 6 (six) hours as needed for wheezing or shortness of breath.    carvedilol (COREG) 25 MG tablet Take 25 mg by mouth 2 (two) times daily with a meal.   clonazePAM (KLONOPIN) 0.5 MG tablet Take 0.5 mg by mouth 2 (  two) times daily as needed for anxiety.   cyclobenzaprine (FLEXERIL) 5 MG tablet Take 5 mg by mouth at bedtime as needed.   Flaxseed, Linseed, (FLAXSEED OIL) 1000 MG CAPS Take 1,000 mg by mouth at bedtime.   gabapentin (NEURONTIN) 600 MG tablet Take 600 mg by mouth at bedtime.   lansoprazole (PREVACID) 30 MG capsule Take 30 mg by mouth daily.   Magnesium Oxide 250 MG TABS Take 500 mg by mouth at bedtime.   meclizine (ANTIVERT) 25 MG tablet Take 25 mg by mouth 3 (three) times daily as needed for dizziness.   rosuvastatin (CRESTOR) 20 MG tablet Take 20 mg by mouth daily.   spironolactone (ALDACTONE) 25 MG tablet Take 1 tablet (25 mg total) by mouth daily.   valsartan (DIOVAN) 320 MG tablet Take 320  mg by mouth daily.   vitamin C (ASCORBIC ACID) 500 MG tablet Take 500 mg by mouth at bedtime.    vitamin E 400 UNIT capsule Take 400 Units by mouth at bedtime.      Allergies:   Codeine, Propoxyphene, Ace inhibitors, and Sulfa antibiotics   Social History   Socioeconomic History   Marital status: Widowed    Spouse name: Not on file   Number of children: Not on file   Years of education: Not on file   Highest education level: Not on file  Occupational History   Not on file  Tobacco Use   Smoking status: Never   Smokeless tobacco: Never  Substance and Sexual Activity   Alcohol use: No   Drug use: No   Sexual activity: Not on file  Other Topics Concern   Not on file  Social History Narrative   Not on file   Social Determinants of Health   Financial Resource Strain: Not on file  Food Insecurity: Not on file  Transportation Needs: Not on file  Physical Activity: Not on file  Stress: Not on file  Social Connections: Not on file     Family History: The patient's family history includes Brain cancer in her brother; Cancer in her brother; Leukemia in her sister; Lung cancer in her father and paternal grandfather; Lymphoma in her brother and sister.  ROS:   Please see the history of present illness.     EKGs/Labs/Other Studies Reviewed:    The following studies were reviewed today:  Cardiac Studies & Procedures       ECHOCARDIOGRAM  ECHOCARDIOGRAM COMPLETE 08/26/2022  Narrative ECHOCARDIOGRAM REPORT    Patient Name:   Cindy Macdonald [facility] Date of Exam: 08/26/2022 Medical Rec #:  [id number]      Height:       64.0 in Accession #:    [id number]     Weight:       148.0 lb Date of Birth:  1941-03-26       BSA:          1.721 m Patient Age:    81 years       BP:           120/80 mmHg Patient Gender: F              HR:           60 bpm. Exam Location:  [place]  Procedure: 2D Echo, Cardiac Doppler and Color Doppler  Indications:    R06.02 SOB  History:        Patient  has prior history of Echocardiogram examinations, most recent 11/30/2017. Signs/Symptoms:Shortness of Breath; Risk Factors:Hypertension, Dyslipidemia and  Non-Smoker.  Sonographer:    Quentin Ore RDMS, RVT, RDCS Referring Phys: 56 [facility] A ARIDA   Sonographer Comments: Technically difficult study due to poor echo windows. Poor DHM and GLS tracking IMPRESSIONS   1. Severe asymmetric septal hypertrophy, septal wall measuring 1.7 cm. no LVOT obstruction. consider CMR for HCM eval.. Left ventricular ejection fraction, by estimation, is 60 to 65%. The left ventricle has normal function. The left ventricle has no regional wall motion abnormalities. There is severe asymmetric left ventricular hypertrophy of the septal segment. Left ventricular diastolic parameters are consistent with Grade I diastolic dysfunction (impaired relaxation). 2. Right ventricular systolic function is normal. The right ventricular size is mildly enlarged. There is mildly elevated pulmonary artery systolic pressure. 3. Left atrial size was moderately dilated. 4. Right atrial size was moderately dilated. 5. The mitral valve is degenerative. No evidence of mitral valve regurgitation. 6. The aortic valve is tricuspid. Aortic valve regurgitation is mild to moderate. Aortic valve sclerosis/calcification is present, without any evidence of aortic stenosis. 7. The inferior vena cava is dilated in size with >50% respiratory variability, suggesting right atrial pressure of 8 mmHg.  FINDINGS Left Ventricle: Severe asymmetric septal hypertrophy, septal wall measuring 1.7 cm. no LVOT obstruction. consider CMR for HCM eval. Left ventricular ejection fraction, by estimation, is 60 to 65%. The left ventricle has normal function. The left ventricle has no regional wall motion abnormalities. The left ventricular internal cavity size was normal in size. There is severe asymmetric left ventricular hypertrophy of the septal segment. Left  ventricular diastolic parameters are consistent with Grade I diastolic dysfunction (impaired relaxation).  Right Ventricle: The right ventricular size is mildly enlarged. No increase in right ventricular wall thickness. Right ventricular systolic function is normal. There is mildly elevated pulmonary artery systolic pressure. The tricuspid regurgitant velocity is 2.68 m/s, and with an assumed right atrial pressure of 8 mmHg, the estimated right ventricular systolic pressure is 36.7 mmHg.  Left Atrium: Left atrial size was moderately dilated.  Right Atrium: Right atrial size was moderately dilated.  Pericardium: There is no evidence of pericardial effusion.  Mitral Valve: The mitral valve is degenerative in appearance. There is moderate thickening of the mitral valve leaflet(s). Mild to moderate mitral annular calcification. No evidence of mitral valve regurgitation.  Tricuspid Valve: The tricuspid valve is normal in structure. Tricuspid valve regurgitation is mild.  Aortic Valve: The aortic valve is tricuspid. Aortic valve regurgitation is mild to moderate. Aortic regurgitation PHT measures 563 msec. Aortic valve sclerosis/calcification is present, without any evidence of aortic stenosis. Aortic valve mean gradient measures 6.6 mmHg. Aortic valve peak gradient measures 12.4 mmHg. Aortic valve area, by VTI measures 1.95 cm.  Pulmonic Valve: The pulmonic valve was normal in structure. Pulmonic valve regurgitation is not visualized.  Aorta: The aortic root and ascending aorta are structurally normal, with no evidence of dilitation.  Venous: The inferior vena cava is dilated in size with greater than 50% respiratory variability, suggesting right atrial pressure of 8 mmHg.  IAS/Shunts: No atrial level shunt detected by color flow Doppler.   LEFT VENTRICLE PLAX 2D LVIDd:         3.80 cm      Diastology LVIDs:         2.60 cm      LV e' medial:    4.68 cm/s LV PW:         1.50 cm      LV  E/e' medial:  17.8 LV IVS:  1.70 cm      LV e' lateral:   5.11 cm/s LVOT diam:     2.00 cm      LV E/e' lateral: 16.3 LV SV:         83 LV SV Index:   48 LVOT Area:     3.14 cm  LV Volumes (MOD) LV vol d, MOD A2C: 104.0 ml LV vol d, MOD A4C: 86.4 ml LV vol s, MOD A2C: 46.5 ml LV vol s, MOD A4C: 36.4 ml LV SV MOD A2C:     57.5 ml LV SV MOD A4C:     86.4 ml LV SV MOD BP:      53.8 ml  RIGHT VENTRICLE             IVC RV S prime:     17.30 cm/s  IVC diam: 2.10 cm TAPSE (M-mode): 3.1 cm  LEFT ATRIUM             Index        RIGHT ATRIUM           Index LA diam:        3.30 cm 1.92 cm/m   RA Area:     29.00 cm LA Vol (A2C):   96.8 ml 56.23 ml/m  RA Volume:   115.00 ml 66.81 ml/m LA Vol (A4C):   60.6 ml 35.20 ml/m LA Biplane Vol: 80.1 ml 46.53 ml/m AORTIC VALVE                     PULMONIC VALVE AV Area (Vmax):    1.88 cm      PV Vmax:       0.73 m/s AV Area (Vmean):   1.90 cm      PV Peak grad:  2.1 mmHg AV Area (VTI):     1.95 cm AV Vmax:           175.80 cm/s AV Vmean:          117.400 cm/s AV VTI:            0.424 m AV Peak Grad:      12.4 mmHg AV Mean Grad:      6.6 mmHg LVOT Vmax:         105.00 cm/s LVOT Vmean:        70.875 cm/s LVOT VTI:          0.263 m LVOT/AV VTI ratio: 0.62 AI PHT:            563 msec  AORTA Ao Root diam: 3.20 cm Ao Asc diam:  3.60 cm Ao Arch diam: 2.6 cm  MITRAL VALVE                TRICUSPID VALVE MV Area (PHT): 3.17 cm     TR Peak grad:   28.7 mmHg MV Decel Time: 239 msec     TR Vmax:        268.00 cm/s MV E velocity: 83.30 cm/s MV A velocity: 134.00 cm/s  SHUNTS MV E/A ratio:  0.62         Systemic VTI:  0.26 m Systemic Diam: 2.00 cm  Debbe Odea MD Electronically signed by Debbe Odea MD Signature Date/Time: 08/26/2022/2:48:27 PM    Final    MONITORS  LONG TERM MONITOR (3-14 DAYS) 08/18/2022  Narrative Patch Wear Time:  13 days and 23 hours (2024-02-28T10:27:38-0500 to  2024-03-13T11:27:34-0400)  Patient had a min HR of 53 bpm, max HR  of 164 bpm, and avg HR of 68 bpm. Predominant underlying rhythm was Sinus Rhythm. 27 Supraventricular Tachycardia runs occurred, the run with the fastest interval lasting 5 beats with a max rate of 164 bpm, the longest lasting 15 beats with an avg rate of 110 bpm. Rare PACs and rare PVCs. Most triggered events did not correlate with arrhythmia.    CARDIAC MRI  MR CARDIAC MORPHOLOGY W WO CONTRAST 09/07/2022  Narrative CLINICAL DATA:  Severe LVH, evaluate for HCM.  EXAM: CARDIAC MRI  TECHNIQUE: The patient was scanned on a 1.5 Tesla Siemens magnet. A dedicated cardiac coil was used. Functional imaging was done using Fiesta sequences. 2,3, and 4 chamber views were done to assess for RWMA's. Modified Simpson's rule using a short axis stack was used to calculate an ejection fraction on a dedicated work [employer]. The patient received 10 cc of Gadavist. After 10 minutes inversion recovery sequences were used to assess for infiltration and scar tissue. Velocity flow mapping performed in the ascending aorta and main pulmonary artery.  CONTRAST:  10 cc  of Gadavist  FINDINGS: 1. Normal left ventricular size. Severe asymmetric LV basal septal hypertrophy, basal septal wall measuring 1.6cm.  Normal systolic function (LVEF = 62%). There are no regional wall motion abnormalities.  There is no late gadolinium enhancement in the left ventricular myocardium.  There is no Left ventricular outflow tract obstruction or gradient.  LVEDV: 133 ml  LVESV: 50 ml  SV: 83 ml  CO: 4.8 L/ min  Myocardial mass: 108g  2. Normal right ventricular size, thickness and systolic function (RVEF = 58%). There are no regional wall motion abnormalities.  3.  Normal left and right atrial size.  4. Normal size of the aortic root, ascending aorta and pulmonary artery.  5.  Mild aortic regurgitation.  6.  Normal  pericardium.  No pericardial effusion.  IMPRESSION: 1. Normal LV size and systolic function. LVEF 62%.  2. Severe asymmetric LV basal septal hypertrophy, basal septal wall measuring 1.6 cm.  3. There is no late gadolinium enhancement in the left ventricular myocardium.  4.  Normal RV size and function.  5. Findings consistent with HCM asymmetric septal variant. no LVOT obstruction, no myocardial scar.   Electronically Signed By: Debbe Odea M.D. On: 09/07/2022 15:48              Recent Labs: 08/29/2022: BUN 23; Creatinine, Ser 0.96; Hemoglobin 12.6; Platelets 239; Potassium 4.6; Sodium 139  Recent Lipid Panel No results found for: "CHOL", "TRIG", "HDL", "CHOLHDL", "VLDL", "LDLCALC", "LDLDIRECT"  Physical Exam:    VS:  BP 104/70   Pulse (!) 57   Ht 5\' 4"  (1.626 m)   Wt 156 lb (70.8 kg)   SpO2 96%   BMI 26.78 kg/m     Wt Readings from Last 3 Encounters:  11/09/22 156 lb (70.8 kg)  08/29/22 149 lb 9.6 oz (67.9 kg)  07/15/22 148 lb (67.1 kg)     GEN:  Well nourished, well developed in no acute distress HEENT: Normal NECK: No JVD CARDIAC: Regular bradycardia, holodiastolic murmur, no rubs, gallops RESPIRATORY:  Clear to auscultation without rales, wheezing or rhonchi  ABDOMEN: Soft, non-tender, non-distended MUSCULOSKELETAL:  No edema; No deformity  SKIN: Warm and dry NEUROLOGIC:  Alert and oriented x 3 PSYCHIATRIC:  Normal affect   ASSESSMENT:    1. Hypertrophic cardiomyopathy (HCC)   2. PSVT (paroxysmal supraventricular tachycardia)   3. Resistant hypertension   4. Left ventricular hypertrophy  5. Nonrheumatic aortic valve insufficiency    PLAN:    Hypertrophic Cardiomyopathy  - Septal Variant, with only chordal SAM - she has mild aortic regurgitation - suspicion of Fabry's/Danon or other mimics of HCM: moderate; she gives a history uncontrolled HTN but it has ben reasonably controlled with some elevations - Gene variant: Pending  - NYHA  I - Non HCM Contributors to disease/status COPD- she is smoke free HTN- on Valsartan with no LVOT gradient at rest, coreg at 25 mg PO BID with some fatigue  Family history reviewed, Discussed family screening  (Daugthers will get echo, Genetic testing could both support the diagnosis of this over mimics and provide assistance from a screening perspective)  SCD  Assessment - POET is ordered - Echo from 2024 notable for hypertrophy of 17 mm - CMR from 2024 notable for no LGE -  2 year assessment for VT on rhythm monitor only showing Rare ectopy and SVT with no symptoms; no AF - SCD risk estimated to be 1.38% though this is not validated for those over 80 SDM: we will not undergo testing for SCD in the future for primary prevention; low risk    Medication symptom plan - patient would not like to change meds at this time; if fatigue persists after POET will cut coreg dose in half  One year with me  Time Spent Directly with Patient:   I have spent a total of 61 minutes with the patient reviewing notes, imaging, EKGs, labs and examining the patient as well as establishing an assessment and plan that was discussed personally with the patient.  > 50% of time was spent in direct patient care and family and reviewing imaging with patient .       Medication Adjustments/Labs and Tests Ordered: Current medicines are reviewed at length with the patient today.  Concerns regarding medicines are outlined above.  Orders Placed This Encounter  Procedures   Cardiac Stress Test: Informed Consent Details: Physician/Practitioner Attestation; Transcribe to consent form and obtain patient signature   EXERCISE TOLERANCE TEST (ETT)   No orders of the defined types were placed in this encounter.   Patient Instructions  Medication Instructions:  Your physician recommends that you continue on your current medications as directed. Please refer to the Current Medication list given to you today.  *If you  need a refill on your cardiac medications before your next appointment, please call your pharmacy*   Lab Work: NONE If you have labs (blood work) drawn today and your tests are completely normal, you will receive your results only by: MyChart Message (if you have MyChart) OR A paper copy in the mail If you have any lab test that is abnormal or we need to change your treatment, we will call you to review the results.   Testing/Procedures: Your physician has requested that you have an exercise tolerance test. For further information please visit [email or web address] Please also follow instruction sheet, as given.    Follow-Up: At [facility], you and your health needs are our priority.  As part of our continuing mission to provide you with exceptional heart care, we have created designated Provider Care Teams.  These Care Teams include your primary Cardiologist (physician) and Advanced Practice Providers (APPs -  Physician Assistants and Nurse Practitioners) who all work together to provide you with the care you need, when you need it.   Your next appointment:   1 year(s)  Provider:   Riley Lam, MD  Signed, Christell Constant, MD  11/09/2022 11:02 AM    [facility]

## 2022-11-14 NOTE — Progress Notes (Signed)
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   Personal Medical Information Jodi Mourning (III.2 on pedigree) is a 82 year old Caucasian woman who is here today with her youngest daughter, Fortino Sic. She reports seeing Dr. Kirke Corin recently when she reportedly had a feeling that something was not right with her heart. Subsequent cardiac imaging studies detected asymmetric LVH with basal septum of 1.6 cm, LVEF of 62% and no scar burden.   She denies having symptoms of chest discomfort, heart palpitations, dyspnea, fatigue, dizziness or syn cope. Reports no functional limitations.  Traditional Risk Factors Dianca was diagnosed with HTN in her 30s and states that it is not well-controlled with medication.  Family history  Relation to Patient Pedigree # Current age Heart condition/age of onset Notes  Daughters, 3 IV.1-IV.3 61, 57, 48 None Echo/EKG not done 82 y.o middle daughter with kidney cancer  Grandchildren V.1-V.7 17-33 None   Brother-1 III.1 83  Cardiac issues- details unknown  Sister-1 III.3 7  Unsure if she has cardiac issues  Brother-2 III.4 Deceased  Fraternal twin brother of III.3 Died of 18 from brain cancer  Sister-2 III.5 70  Leukemia/lymphoma  Fraternal twin Brothers-3,4 III.6-III.7 Deceased None III.6- Died in MVA while hitchhiking @ 19 III.7- Unsure of cause of death and age  Nephews, nieces IV.4-IV.6 36, 51, 47 26 y.o w/ 3x M.I.         Father II.1 Deceased M.I.- NO STENTS  Died @ 84 from lung cancer   Paternal Uncles/aunts Not listed Deceased ? ?  Paternal grandfather 1.1 Deceased ? Died @ ? of ?   Paternal grandmother I.2 Deceased ? Died @ ? of ?         Mother II.2 Deceased None Died of M.I. @ 1  Maternal Aunts, Uncles ? Deceased ? ?  Maternal grandfather I.3 Deceased  unaware Died @ ? of ?  Maternal grandmother I.4 Deceased unaware Died from complications of childbirth   Genetics Chelsia was counseled on the genetics of hypertrophic cardiomyopathy (HCM). I explained to the patient that this is an autosomal dominant condition with incomplete penetrance i.e. not all individuals harboring the HCM mutation will present clinically with HCM, and age-related penetrance where clinical presentation of HCM increases with advanced age. Variability in clinical expression is also seen in families with HCM with affected family members presenting clinically at different ages and with symptoms ranging from mild to severe.  Since HCM is an autosomal dominant condition, first degree-relatives should seek regular surveillance for HCM.  Her first-degree relatives include her three daughters and a brother and two sisters. Clinical screening of first-degree relatives involves echocardiogram and EKG at regular intervals, frequency is typically determined by age, with those in their teens undergoing screening every year and those over the age of 84 getting screened every 3-5 years until the age of 45. Patient verbalized understanding of this.  I informed the patient that about 8-10% of HCM patients can have compound and digenic mutations for HCM. Also briefly discussed the inheritance pattern and treatment /management plans for the infiltrative cardiomyopathies that present as HCM phenocopies.   We walked through the process of genetic testing. I explained to the patient that genetic testing is a probabilistic test dependent upon age and severity of presentation, presence of risk factors for HCM and importantly family  history of HCM or sudden death in first-degree relatives.   The potential outcomes of genetic testing and subsequent management of at-risk family members were discussed so as to manage expectations-  If a mutation is not identified, then all first-degree relatives should  undergo regular screening for HCM. I emphasized that even if the genetic test is negative, it does not mean that he does not have HCM. A negative test result can be due to limitations of the genetic test. Patient verbalized understanding of this.  There is also the likelihood of identifying a "Variant of unknown significance". This result means that the variant has not been detected in a statistically significant number of HCM patients and/or functional studies have not been performed to verify its pathogenicity. This VUS can be tested in the family to see if it segregates with disease. If a VUS is found, first-degree relatives should undergo regular clinical screening for HCM.  If a pathogenic variant is reported, then first-degree family members can get tested for this variant. If they test positive, it is likely they will develop HCM. In light of variable expression and incomplete penetrance associated with HCM, it is not possible to predict when they will manifest clinically with HCM. It is recommended that family members that test positive for the familial pathogenic variant pursue clinical screening for HCM. Family members that test negative for the familial mutation need not pursue periodic screening for HCM, but seek care if symptoms develop.   Impression  Shadie is asymptomatic but has cardiac wall thickness indicative of HCM. Additionally, she reports no family history of HCM or sudden death. With her parents living to their 42s and 20s, it is likely that she has a de novo mutation for HCM. Her children have a 50% chance of inheriting this condition as HCM is an autosomal dominant disorder.  Genetic testing would be appropriate to provide cascade testing of the family members. This test should include the sarcomeric genes implicated in HCM as well as the genes for the HCM phenocopies, such as Fabry disease, Danon disease, WPW syndrome and familial transthyretin amyloidosis.   In addition, we  discussed the protections afforded by the Genetic Information Non-Discrimination Act (GINA). I explained to the patient that GINA protects them from losing their employment or health insurance based on their genotype. However, these protections do not cover life insurance and disability. Patient verbalized understanding of this and states that her youngest daughter has life insurance, but is not sure about the older two daughters.  Please note that the patient has not been counseled in this visit on other personal, cultural or ethical issues that she may face due to her heart condition.   Plan Lakelyn declined genetic testing due to non-coverage issues by Medicare. She will inform her daughters to undergo screening for HCM by echo and EKG.   Sidney Ace, Ph.D, [facility] Clinical Molecular Geneticist

## 2022-11-27 ENCOUNTER — Other Ambulatory Visit: Payer: Self-pay | Admitting: Cardiovascular Disease

## 2022-11-29 ENCOUNTER — Ambulatory Visit: Payer: PPO | Attending: Internal Medicine

## 2022-11-29 DIAGNOSIS — I422 Other hypertrophic cardiomyopathy: Secondary | ICD-10-CM | POA: Diagnosis not present

## 2022-11-29 LAB — EXERCISE TOLERANCE TEST
Angina Index: 0
Base ST Depression (mm): 0 mm
Duke Treadmill Score: 4
Estimated workload: 5.5
Exercise duration (min): 4 min
Exercise duration (sec): 20 s
MPHR: 139 {beats}/min
Peak HR: 88 {beats}/min
Percent HR: 63 %
RPE: 15
Rest HR: 64 {beats}/min
ST Depression (mm): 0 mm

## 2022-11-30 ENCOUNTER — Telehealth: Payer: Self-pay

## 2022-11-30 MED ORDER — CARVEDILOL 12.5 MG PO TABS: 12.5000 mg | ORAL_TABLET | Freq: Two times a day (BID) | ORAL | 3 refills | Status: DC

## 2022-11-30 NOTE — Telephone Encounter (Signed)
The patient has been notified of the result and verbalized understanding.  All questions (if any) were answered. Arvid Right Rainna Nearhood, RN 11/30/2022 11:59 AM   Pt reports fatigue is not as bad family member reports pt has fatigue.  Pt expressed BP low.  Advised pt to decrease dose of carvedilol to 12.5 mg PO BID.  Keep record of BP if has any concerns to contact our office.  Pt had no further questions or concerns.

## 2022-11-30 NOTE — Telephone Encounter (Signed)
-----   Message from Christell Constant, MD sent at 11/29/2022  5:53 PM EDT ----- Results: Rare PVCs; no evidence of exercise induced hypotension/syncope Plan: If fatigue persists, we will halve coreg dose  Christell Constant, MD

## 2022-12-17 ENCOUNTER — Other Ambulatory Visit: Payer: Self-pay | Admitting: Cardiovascular Disease

## 2023-02-16 ENCOUNTER — Other Ambulatory Visit: Payer: Self-pay | Admitting: Cardiovascular Disease

## 2023-02-17 NOTE — Telephone Encounter (Signed)
Hi Cindy Macdonald,  Will you please outreach patient to schedule past due 2 month follow up.  Thank you,

## 2023-02-21 NOTE — Telephone Encounter (Signed)
Last seen by general cardiology on 08/29/22 with plan to f/u in 2 months.  The 2 month f/u was cancelled wih reason:  Patient (pt daughter called in stating pt will be seeing HOCM clinic and doesn't want to f/u with gen card right now)

## 2023-06-07 ENCOUNTER — Other Ambulatory Visit: Payer: Self-pay | Admitting: Physician Assistant

## 2023-06-08 NOTE — Telephone Encounter (Signed)
This is a [place] pt 

## 2023-06-08 NOTE — Telephone Encounter (Signed)
Last office visit: 08/29/22 with plan to f/u 2 months. next office visit: none/no active recall  cx 11/04/22 with reason: Patient (pt daughter called in stating pt will be seeing HOCM clinic and doesn't want to f/u with gen card right now)   Please schedule follow visit, Thanks

## 2023-07-02 ENCOUNTER — Emergency Department: Payer: PPO

## 2023-07-02 ENCOUNTER — Other Ambulatory Visit: Payer: Self-pay

## 2023-07-02 DIAGNOSIS — R930 Abnormal findings on diagnostic imaging of skull and head, not elsewhere classified: Secondary | ICD-10-CM | POA: Insufficient documentation

## 2023-07-02 DIAGNOSIS — I672 Cerebral atherosclerosis: Secondary | ICD-10-CM | POA: Diagnosis not present

## 2023-07-02 DIAGNOSIS — R519 Headache, unspecified: Secondary | ICD-10-CM | POA: Diagnosis present

## 2023-07-02 DIAGNOSIS — J320 Chronic maxillary sinusitis: Secondary | ICD-10-CM | POA: Diagnosis not present

## 2023-07-02 DIAGNOSIS — Z8673 Personal history of transient ischemic attack (TIA), and cerebral infarction without residual deficits: Secondary | ICD-10-CM | POA: Diagnosis not present

## 2023-07-02 DIAGNOSIS — D329 Benign neoplasm of meninges, unspecified: Secondary | ICD-10-CM | POA: Diagnosis not present

## 2023-07-02 DIAGNOSIS — I6782 Cerebral ischemia: Secondary | ICD-10-CM | POA: Diagnosis not present

## 2023-07-02 DIAGNOSIS — J321 Chronic frontal sinusitis: Secondary | ICD-10-CM | POA: Diagnosis not present

## 2023-07-02 DIAGNOSIS — J449 Chronic obstructive pulmonary disease, unspecified: Secondary | ICD-10-CM | POA: Insufficient documentation

## 2023-07-02 DIAGNOSIS — Z20822 Contact with and (suspected) exposure to covid-19: Secondary | ICD-10-CM | POA: Insufficient documentation

## 2023-07-02 LAB — BASIC METABOLIC PANEL
Anion gap: 10 (ref 5–15)
BUN: 25 mg/dL — ABNORMAL HIGH (ref 8–23)
CO2: 24 mmol/L (ref 22–32)
Calcium: 9.1 mg/dL (ref 8.9–10.3)
Chloride: 103 mmol/L (ref 98–111)
Creatinine, Ser: 0.85 mg/dL (ref 0.44–1.00)
GFR, Estimated: >60 mL/min (ref >60)
Glucose, Bld: 150 mg/dL — ABNORMAL HIGH (ref 70–99)
Potassium: 3.7 mmol/L (ref 3.5–5.1)
Sodium: 137 mmol/L (ref 135–145)

## 2023-07-02 LAB — CBC
HCT: 32 % — ABNORMAL LOW (ref 36.0–46.0)
Hemoglobin: 10.1 g/dL — ABNORMAL LOW (ref 12.0–15.0)
MCH: 28 pg (ref 26.0–34.0)
MCHC: 31.6 g/dL (ref 30.0–36.0)
MCV: 88.6 fL (ref 80.0–100.0)
Platelets: 212 10*3/uL (ref 150–400)
RBC: 3.61 MIL/uL — ABNORMAL LOW (ref 3.87–5.11)
RDW: 14.5 % (ref 11.5–15.5)
WBC: 8 10*3/uL (ref 4.0–10.5)
nRBC: 0 % (ref 0.0–0.2)

## 2023-07-02 NOTE — ED Triage Notes (Signed)
 Pt to ed from home via POV for headache. Pt woke up at 430 am with headache on the left side.  Pt has taken tylenol  with no relief today. Pt denies any blurred vision, nausea or vomiting. Pt is caox4, in no acute distress and ambulatory in triage.

## 2023-07-03 ENCOUNTER — Encounter: Payer: Self-pay | Admitting: Radiology

## 2023-07-03 ENCOUNTER — Emergency Department
Admission: EM | Admit: 2023-07-03 | Discharge: 2023-07-03 | Disposition: A | Discharge status: Home / Self Care | Payer: PPO | Attending: Emergency Medicine | Admitting: Emergency Medicine

## 2023-07-03 ENCOUNTER — Emergency Department: Payer: PPO

## 2023-07-03 DIAGNOSIS — D329 Benign neoplasm of meninges, unspecified: Secondary | ICD-10-CM

## 2023-07-03 DIAGNOSIS — R519 Headache, unspecified: Secondary | ICD-10-CM

## 2023-07-03 LAB — RESP PANEL BY RT-PCR (RSV, FLU A&B, COVID)  RVPGX2
Influenza A by PCR: NEGATIVE
Influenza B by PCR: NEGATIVE
Resp Syncytial Virus by PCR: NEGATIVE
SARS Coronavirus 2 by RT PCR: NEGATIVE

## 2023-07-03 MED ORDER — DIPHENHYDRAMINE HCL 50 MG/ML IJ SOLN
12.5000 mg | Freq: Once | INTRAMUSCULAR | Status: AC
  Administered 2023-07-03: 12.5 mg via INTRAVENOUS
  Filled 2023-07-03: qty 1

## 2023-07-03 MED ORDER — SODIUM CHLORIDE 0.9 % IV BOLUS
500.0000 mL | Freq: Once | INTRAVENOUS | Status: AC
  Administered 2023-07-03: 500 mL via INTRAVENOUS

## 2023-07-03 MED ORDER — GADOBUTROL 1 MMOL/ML IV SOLN
7.0000 mL | Freq: Once | INTRAVENOUS | Status: AC | PRN
  Administered 2023-07-03: 7 mL via INTRAVENOUS

## 2023-07-03 MED ORDER — FENTANYL CITRATE PF 50 MCG/ML IJ SOSY
25.0000 ug | PREFILLED_SYRINGE | Freq: Once | INTRAMUSCULAR | Status: AC
  Administered 2023-07-03: 25 ug via INTRAVENOUS
  Filled 2023-07-03: qty 1

## 2023-07-03 MED ORDER — AZITHROMYCIN 250 MG PO TABS: 250.0000 mg | ORAL_TABLET | Freq: Every day | ORAL | 0 refills | Status: DC

## 2023-07-03 MED ORDER — ONDANSETRON 4 MG PO TBDP: 4.0000 mg | ORAL_TABLET | Freq: Three times a day (TID) | ORAL | 0 refills | Status: AC | PRN

## 2023-07-03 MED ORDER — HYDROCODONE-ACETAMINOPHEN 5-325 MG PO TABS: 1.0000 | ORAL_TABLET | Freq: Four times a day (QID) | ORAL | 0 refills | Status: AC | PRN

## 2023-07-03 MED ORDER — AZITHROMYCIN 500 MG PO TABS
500.0000 mg | ORAL_TABLET | Freq: Once | ORAL | Status: AC
  Administered 2023-07-03: 500 mg via ORAL
  Filled 2023-07-03: qty 1

## 2023-07-03 MED ORDER — METOCLOPRAMIDE HCL 5 MG/ML IJ SOLN
5.0000 mg | Freq: Once | INTRAMUSCULAR | Status: AC
  Administered 2023-07-03: 5 mg via INTRAVENOUS
  Filled 2023-07-03: qty 2

## 2023-07-03 MED ORDER — HYDROCODONE-ACETAMINOPHEN 5-325 MG PO TABS
1.0000 | ORAL_TABLET | Freq: Once | ORAL | Status: AC
  Administered 2023-07-03: 1 via ORAL
  Filled 2023-07-03: qty 1

## 2023-07-03 NOTE — Discharge Instructions (Addendum)
 Take and finish antibiotic as prescribed.  You may take medicines as needed for pain and nausea.  Return to the ER for worsening symptoms, persistent vomiting, lethargy or other concerns.

## 2023-07-03 NOTE — ED Provider Notes (Signed)
 [facility] Provider Note    Event Date/Time   First MD Initiated Contact with Patient 07/03/23 0007     (approximate)   History   Headache   HPI  Cindy Macdonald is a 83 y.o. female who presents to the ED from home with a chief complaint of headache.  No history of migraines.  Patient awoke around 4:30 AM with left-sided headache.  Throughout the day headache was partially relieved with Tylenol , then rebounded.  Denies associated vision changes, nausea/vomiting, photophobia or dizziness.  Denies recent fever/chills, chest pain, shortness of breath.  Denies slurred speech, facial droop, extremity weakness/numbness/nausea.  Does complain of sinus pressure.  Denies anticoagulant use.     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   Ruptured right breast implant 12/05/2017     Past Surgical History   Past Surgical History:  Procedure Laterality Date   COLONOSCOPY  2015   Dr Dawne Euler   COLONOSCOPY WITH PROPOFOL  N/A 03/06/2019   Procedure: COLONOSCOPY WITH PROPOFOL ;  Surgeon: Marshall Skeeter, MD;  Location: [facility] ENDOSCOPY;  Service: Endoscopy;  Laterality: N/A;   ESOPHAGOGASTRODUODENOSCOPY (EGD) WITH PROPOFOL  N/A 03/06/2019   Procedure: ESOPHAGOGASTRODUODENOSCOPY (EGD) WITH PROPOFOL ;  Surgeon: Marshall Skeeter, MD;  Location: [facility] ENDOSCOPY;  Service: Endoscopy;   Laterality: N/A;   HEMORRHOID SURGERY     PARATHYROIDECTOMY     PLACEMENT OF BREAST IMPLANTS     age 51   UPPER GI ENDOSCOPY     dr Felicita Horns     Home Medications   Prior to Admission medications   Medication Sig Start Date End Date Taking? Authorizing Provider  azithromycin  (ZITHROMAX ) 250 MG tablet Take 1 tablet (250 mg total) by mouth daily. 07/03/23  Yes Norlene Beavers, MD  HYDROcodone -acetaminophen  (NORCO/VICODIN) 5-325 MG tablet Take 1 tablet by mouth every 6 (six) hours as needed for moderate pain (pain score 4-6). 07/03/23  Yes Norlene Beavers, MD  ondansetron  (ZOFRAN -ODT) 4 MG disintegrating tablet Take 1 tablet (4 mg total) by mouth every 8 (eight) hours as needed for nausea or vomiting. 07/03/23  Yes Norlene Beavers, MD  acetaminophen  (TYLENOL ) 500 MG tablet Take 1,000 mg by mouth every 6 (six) hours as needed for headache (pain).     [provider]  albuterol (PROVENTIL HFA;VENTOLIN HFA) 108 (90 Base) MCG/ACT inhaler Inhale 2 puffs into the lungs every 6 (six) hours as needed for wheezing or shortness of breath.     [provider]  amLODipine  (NORVASC ) 5 MG tablet Take 1 tablet (5 mg total) by mouth daily. Please schedule visit with general cardiology for future refills. 06/08/23 09/06/23  Wenona Hamilton, MD  carvedilol  (COREG ) 12.5 MG tablet Take 1 tablet (12.5 mg total) by mouth 2 (two) times daily. 11/30/22   Chandrasekhar, Mahesh A, MD  clonazePAM (KLONOPIN) 0.5 MG tablet Take 0.5 mg by mouth 2 (two)  times daily as needed for anxiety.    [provider]  cyclobenzaprine (FLEXERIL) 5 MG tablet Take 5 mg by mouth at bedtime as needed.    [provider]  Flaxseed, Linseed, (FLAXSEED OIL) 1000 MG CAPS Take 1,000 mg by mouth at bedtime.    [provider]  gabapentin (NEURONTIN) 600 MG tablet Take 600 mg by mouth at bedtime.    [provider]  lansoprazole (PREVACID) 30 MG capsule Take 30 mg by mouth daily.    [provider]   Magnesium Oxide 250 MG TABS Take 500 mg by mouth at bedtime.    [provider]  meclizine (ANTIVERT) 25 MG tablet Take 25 mg by mouth 3 (three) times daily as needed for dizziness.    [provider]  rosuvastatin (CRESTOR) 20 MG tablet Take 20 mg by mouth daily.    [provider]  spironolactone  (ALDACTONE ) 25 MG tablet Take 1 tablet (25 mg total) by mouth daily. 02/21/23   Wenona Hamilton, MD  valsartan (DIOVAN) 320 MG tablet Take 320 mg by mouth daily.    [provider]  vitamin C (ASCORBIC ACID) 500 MG tablet Take 500 mg by mouth at bedtime.     [provider]  vitamin E 400 UNIT capsule Take 400 Units by mouth at bedtime.     [provider]     Allergies  Codeine, Propoxyphene, Ace inhibitors, and Sulfa antibiotics   Family History   Family History  Problem Relation Age of Onset   Lung cancer Father    Brain cancer Brother    Lung cancer Paternal Grandfather    Lymphoma Sister    Leukemia Sister    Cancer Brother    Lymphoma Brother      Physical Exam  Triage Vital Signs: ED Triage Vitals  Encounter Vitals Group     BP 07/02/23 2247 (!) 153/81     Systolic BP Percentile --      Diastolic BP Percentile --      Pulse Rate 07/02/23 2247 80     Resp 07/02/23 2247 16     Temp 07/02/23 2247 98 F (36.7 C)     Temp Source 07/02/23 2247 Oral     SpO2 07/02/23 2247 98 %     Weight --      Height 07/02/23 2248 5\' 5"  (1.651 m)     Head Circumference --      Peak Flow --      Pain Score 07/02/23 2247 7     Pain Loc --      Pain Education --      Exclude from Growth Chart --     Updated Vital Signs: BP (!) 197/97 (BP Location: Right Arm)   Pulse 76   Temp 98 F (36.7 C) (Oral)   Resp 18   Ht 5\' 5"  (1.651 m)   SpO2 99%   BMI 25.96 kg/m    General: Awake, no distress.  CV:  RRR.  Good peripheral perfusion.  Resp:  Normal effort.  CTAB. Abd:  Nontender.  No distention.  Other:  PERRL.  EOMI.  Mild  frontal sinus tenderness to palpation.  No carotid bruits.  Supple neck without meningismus.  Alert and oriented x 3.  CN II-XII grossly intact.  5/5 motor strength and sensation all extremities.   ED Results / Procedures / Treatments  Labs (all labs ordered are listed, but only abnormal results are displayed) Labs Reviewed  CBC - Abnormal; Notable for the following components:      Result Value   RBC 3.61 (*)    Hemoglobin 10.1 (*)    HCT 32.0 (*)    All other components within normal limits  BASIC METABOLIC PANEL - Abnormal; Notable for the following components:   Glucose, Bld 150 (*)    BUN 25 (*)    All other components within normal limits  RESP PANEL BY RT-PCR (RSV, FLU A&B, COVID)  [id number]     EKG  None   RADIOLOGY I have independently visualized and interpreted patient's imaging studies as well as noted the radiology interpretation:  CT head: No acute ICH, 8 mm hypodensity likely small meningioma but recommend brain MRI with and without contrast to rule out underlying hemorrhage  MRI brain: No acute ICH, 1 cm right tentorial meningioma, chronic sinus disease  Official radiology report(s): MR BRAIN W WO CONTRAST Result Date: 07/03/2023 CLINICAL DATA:  Initial evaluation for acute headache, abnormal CT. EXAM: MRI HEAD WITHOUT AND WITH CONTRAST TECHNIQUE: Multiplanar, multiecho pulse sequences of the brain and surrounding structures were obtained without and with intravenous contrast. CONTRAST:  7mL GADAVIST  GADOBUTROL  1 MMOL/ML IV SOLN COMPARISON:  CT from 07/02/2023.  Few FINDINGS: Brain: Cerebral volume within normal limits. Patchy T2/FLAIR hyperintensity involving the periventricular deep white matter, consistent with chronic small vessel ischemic disease, mild to moderate in nature. No evidence for acute or subacute infarct. No areas of chronic cortical infarction. No acute or chronic intracranial blood products. 1 cm enhancing lesion along the undersurface of the right  tentorium, consistent with a small meningioma, corresponding with abnormality on prior CT (series 15, image 56). No associated edema or mass effect. No other mass lesion or abnormal enhancement. No hydrocephalus or extra-axial fluid collection. Pituitary gland suprasellar region within normal limits. Vascular: Major intracranial vascular flow voids are maintained. Skull and upper cervical spine: Traceable junction with normal limits. Bone marrow signal intensity normal. No scalp soft tissue abnormality. Sinuses/Orbits: Prior bilateral ocular lens replacement. 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 No significant mastoid effusion. Other: None. IMPRESSION: 1. No acute intracranial abnormality. 2. 1 cm right tentorial meningioma, corresponding with abnormality on prior CT. No associated edema or mass effect. 3. Underlying mild to moderate chronic microvascular ischemic disease. 4. 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 Electronically Signed   By: Virgia Griffins M.D.   On: 07/03/2023 02:22   CT Head Wo Contrast Result Date: 07/02/2023 CLINICAL DATA:  Initial evaluation for acute headache, classic migraine. EXAM: CT HEAD WITHOUT CONTRAST TECHNIQUE: Contiguous axial images were obtained from the base of the skull through the vertex without intravenous contrast. RADIATION DOSE REDUCTION: This exam was performed according to the departmental dose-optimization program which includes automated exposure control, adjustment of the mA and/or kV according to patient size and/or use of iterative reconstruction technique. COMPARISON:  Prior CT from 01/14/2018. FINDINGS: Brain: Mild age-related cerebral atrophy with chronic microvascular ischemic disease. Remote right thalamic lacunar infarct. No acute cortically based infarct. 8 mm hyperdensity seen along the undersurface of the right tentorium, favored to reflect a small meningioma (series 4, image 53). No other acute intracranial  hemorrhage. No other mass lesion or midline shift. No hydrocephalus or extra-axial fluid collection. Vascular: No abnormal hyperdense vessel. Calcified atherosclerosis present at skull base. Skull: Scalp soft tissues within normal limits.  Calvarium intact. Sinuses/Orbits: Prior bilateral ocular lens replacement. Chronic left frontoethmoidal and maxillary sinusitis noted. Mastoid air cells are clear. Other: None. IMPRESSION:  1. No acute intracranial abnormality. 2. 8 mm hyperdensity along the undersurface of the right tentorium, favored to reflect a small meningioma, although correlation with dedicated brain MRI, with and without contrast recommended for confirmation and to ensure no underlying hemorrhages present at this location. 3. Mild age-related cerebral atrophy with chronic microvascular ischemic disease. 4. Chronic left frontoethmoidal and maxillary sinusitis. Results were called by telephone at the time of interpretation on 07/02/2023 at 11:42 pm to the emergency room physician taking care of the patient. Electronically Signed   By: Virgia Griffins M.D.   On: 07/02/2023 23:43     PROCEDURES:  Critical Care performed: No  .1-3 Lead EKG Interpretation  Performed by: Norlene Beavers, MD Authorized by: Norlene Beavers, MD     Interpretation: normal     ECG rate:  80   ECG rate assessment: normal     Rhythm: sinus rhythm     Ectopy: none     Conduction: normal   Comments:     Patient placed on cardiac monitor to evaluate for arrhythmias    MEDICATIONS ORDERED IN ED: Medications  azithromycin  (ZITHROMAX ) tablet 500 mg (has no administration in time range)  HYDROcodone -acetaminophen  (NORCO/VICODIN) 5-325 MG per tablet 1 tablet (has no administration in time range)  sodium chloride  0.9 % bolus 500 mL (0 mLs Intravenous Stopped 07/03/23 0218)  metoCLOPramide  (REGLAN ) injection 5 mg (5 mg Intravenous Given 07/03/23 0050)  diphenhydrAMINE  (BENADRYL ) injection 12.5 mg (12.5 mg Intravenous Given  07/03/23 0049)  gadobutrol  (GADAVIST ) 1 MMOL/ML injection 7 mL (7 mLs Intravenous Contrast Given 07/03/23 0116)  fentaNYL  (SUBLIMAZE ) injection 25 mcg (25 mcg Intravenous Given 07/03/23 0217)     IMPRESSION / MDM / ASSESSMENT AND PLAN / ED COURSE  I reviewed the triage vital signs and the nursing notes.                             83 year old female presenting with headache. Differential diagnosis includes, but is not limited to, intracranial hemorrhage, meningitis/encephalitis, previous head trauma, cavernous venous thrombosis, tension headache, temporal arteritis, migraine or migraine equivalent, idiopathic intracranial hypertension, and non-specific headache.  I personally reviewed patient's records and noted a cardiology office visit on 11/09/2022 for hypertrophic cardiomyopathy, PSVT, hypertension.  Patient's presentation is most consistent with acute complicated illness / injury requiring diagnostic workup.  The patient is on the cardiac monitor to evaluate for evidence of arrhythmia and/or significant heart rate changes.  Laboratory results unremarkable, respiratory panel negative.  Radiology recommending MRI brain with/without contrast based on CT head area suspicious for meningioma versus hemorrhage.  Will administer IV Reglan  and Benadryl  for headache, IV fluids and reassess.  Clinical Course as of 07/03/23 0302  Mon Jul 03, 2023  0254 Updated patient and family members on MRI results.  She is feeling better.  Will place on Z-Pak for frontal sinus tenderness and chronic inflammation seen on MRI.  Will refer to neurosurgery for follow-up of meningioma.  Placed on as needed Norco and Zofran .  Strict return precautions given.  Patient and family members verbalized understanding and agree with plan of care. [JS]    Clinical Course User Index [JS] Norlene Beavers, MD     FINAL CLINICAL IMPRESSION(S) / ED DIAGNOSES   Final diagnoses:  Acute nonintractable headache, unspecified headache  type  Sinus headache  Meningioma (HCC)     Rx / DC Orders   ED Discharge Orders  Ordered    azithromycin  (ZITHROMAX ) 250 MG tablet  Daily        07/03/23 0257    ondansetron  (ZOFRAN -ODT) 4 MG disintegrating tablet  Every 8 hours PRN        07/03/23 0257    HYDROcodone -acetaminophen  (NORCO/VICODIN) 5-325 MG tablet  Every 6 hours PRN        07/03/23 0257             Note:  This document was prepared using Dragon voice recognition software and may include unintentional dictation errors.   Latesa Fratto J, MD 07/03/23 [phone]

## 2023-07-25 ENCOUNTER — Ambulatory Visit
Admission: RE | Admit: 2023-07-25 | Discharge: 2023-07-25 | Disposition: A | Discharge status: Home / Self Care | Source: Ambulatory Visit | Attending: Nurse Practitioner | Admitting: Nurse Practitioner

## 2023-07-25 ENCOUNTER — Other Ambulatory Visit: Payer: Self-pay | Admitting: Nurse Practitioner

## 2023-07-25 DIAGNOSIS — J40 Bronchitis, not specified as acute or chronic: Secondary | ICD-10-CM | POA: Insufficient documentation

## 2023-09-01 ENCOUNTER — Other Ambulatory Visit (HOSPITAL_COMMUNITY): Payer: Self-pay

## 2023-09-01 ENCOUNTER — Telehealth: Payer: Self-pay | Admitting: Cardiovascular Disease

## 2023-09-01 MED ORDER — AMLODIPINE BESYLATE 5 MG PO TABS
5.0000 mg | ORAL_TABLET | Freq: Every day | ORAL | 0 refills | Status: DC
  Filled 2023-09-01: qty 90, 90d supply, fill #0

## 2023-09-01 MED ORDER — AMLODIPINE BESYLATE 5 MG PO TABS: 5.0000 mg | ORAL_TABLET | Freq: Every day | ORAL | 0 refills | Status: DC

## 2023-09-01 NOTE — Telephone Encounter (Signed)
*  STAT* If patient is at the pharmacy, call can be transferred to refill team.   1. Which medications need to be refilled? (please list name of each medication and dose if known) amLODipine (NORVASC) 5 MG tablet   2. Which pharmacy/location (including street and city if local pharmacy) is medication to be sent to?  [id number] - [place], [place] - [id number]      3. Do they need a 30 day or 90 day supply? 90 day    Pt has office visit scheduled for 11/29/2023 and placed on wait list for sooner appt.

## 2023-09-05 ENCOUNTER — Encounter: Payer: Self-pay | Admitting: Physician Assistant

## 2023-09-12 NOTE — Progress Notes (Unsigned)
 Cardiology Office Note    Date:  09/13/2023   ID:  Cindy Macdonald, Cindy Macdonald 1940-07-20, MRN [id number]  PCP:  Aloha Arnold, PA-C  Cardiologist:  Antionette Kirks, MD  Electrophysiologist:  None   Chief Complaint: Follow-up  History of Present Illness:   Cindy Macdonald is a 83 y.o. 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   She was seen in 2019 for resistant hypertension with negative secondary hypertension workup.  At that time metoprolol was transitioned to carvedilol  and spironolactone  was added to HCTZ.  She had episodes of hypotension following this meeting spironolactone  to be discontinued.  She reestablished care in 06/2022 noting significant fluctuation of blood pressure readings can be close to 200 with significant drop following one dose of clonidine .  She reported feeling sluggish and noted intermittent palpitations with shortness of breath without frank chest pain.  Blood pressure in the office was 120/80 with heart rate of 56 bpm.  It was felt clonidine  was likely contributing to her fatigue and significant fluctuation in blood pressure.  This medication was discontinued.  Diltiazem  was decreased to 120 mg daily with transition to amlodipine  in the future if heart rate remained stable.  Torsemide was discontinued.  Spironolactone  25 mg was added.  Zio patch showed a predominant rhythm of sinus with an average rate of 68 bpm (range 53 to 164 bpm), 27 episodes of SVT with the fastest interval lasting 5 beats and the longest interval lasting 15 beats.  Rare PACs and PVCs.  Most triggered events did not correlate with arrhythmia.  Echo in 08/2022 showed an EF of 60 to 65%, severe asymmetric septal hypertrophy with septal wall measuring 1.7 cm with no LVOT obstruction, normal wall motion, grade 1 diastolic dysfunction, normal RV systolic function with  mildly enlarged ventricular cavity size, mildly elevated PASP, moderate biatrial enlargement, mild to moderate aortic insufficiency, aortic valve sclerosis without evidence of stenosis, and an estimated right atrial pressure of 8 mmHg.  She was last seen by general cardiology in 08/2022 and reported less fluctuation in BP since making the above changes.  Cardiac MRI in 08/2022 that showed an EF of 62% with severe asymmetric LV basal septal hypertrophy with no LGE and normal RV systolic function and ventricular cavity size.  Findings were consistent with HCM septal variant only chordal SAM and without LVOT obstruction.  She was evaluated by Dr. Paulita Boss in 10/2022 for HCM with ETT in 11/2022 showing no evidence of exercise-induced hypotension or syncope.  She comes in doing well from a cardiac perspective and is without symptoms of angina or cardiac decompensation.  No dizziness, presyncope, or syncope no palpitations.  No falls or symptoms concerning for bleeding.  Chronic fatigue is stable.  She does add salt to food when cooking.  Not actively monitoring sodium intake.  Blood pressure at home is typically in the 130s to 140s systolic.  She does not have any acute cardiac concerns at this time.   Labs independently reviewed: 06/2023 - potassium 3.7, BUN 25, serum creatinine 0.85 Hgb 10.1, PLT 212 01/2019 - albumin 3.8, AST/ALT normal  Past Medical History:  Diagnosis Date   Barrett's esophagus    Benign tumor of parathyroid gland    Colon polyps    COPD (chronic obstructive pulmonary disease) (HCC)    GERD (gastroesophageal reflux disease)    Hx of breast implants,  bilateral    Hyperlipidemia    Hypertension     Past Surgical History:  Procedure Laterality Date   COLONOSCOPY  2015   Dr Dawne Euler   COLONOSCOPY WITH PROPOFOL  N/A 03/06/2019   Procedure: COLONOSCOPY WITH PROPOFOL ;  Surgeon: Marshall Skeeter, MD;  Location: [facility]COPY;  Service: Endoscopy;  Laterality: N/A;    ESOPHAGOGASTRODUODENOSCOPY (EGD) WITH PROPOFOL  N/A 03/06/2019   Procedure: ESOPHAGOGASTRODUODENOSCOPY (EGD) WITH PROPOFOL ;  Surgeon: Marshall Skeeter, MD;  Location: [facility] ENDOSCOPY;  Service: Endoscopy;  Laterality: N/A;   HEMORRHOID SURGERY     PARATHYROIDECTOMY     PLACEMENT OF BREAST IMPLANTS     age 88   UPPER GI ENDOSCOPY     dr Felicita Horns    Current Medications: Current Meds  Medication Sig   acetaminophen  (TYLENOL ) 500 MG tablet Take 1,000 mg by mouth every 6 (six) hours as needed for headache (pain).    albuterol (PROVENTIL HFA;VENTOLIN HFA) 108 (90 Base) MCG/ACT inhaler Inhale 2 puffs into the lungs every 6 (six) hours as needed for wheezing or shortness of breath.    carvedilol  (COREG ) 25 MG tablet Take 25 mg by mouth 2 (two) times daily.   clonazePAM (KLONOPIN) 0.5 MG tablet Take 0.5 mg by mouth 2 (two) times daily as needed for anxiety.   cyclobenzaprine (FLEXERIL) 5 MG tablet Take 5 mg by mouth at bedtime as needed.   Flaxseed, Linseed, (FLAXSEED OIL) 1000 MG CAPS Take 1,000 mg by mouth at bedtime.   gabapentin (NEURONTIN) 600 MG tablet Take 600 mg by mouth at bedtime.   HYDROcodone -acetaminophen  (NORCO/VICODIN) 5-325 MG tablet Take 1 tablet by mouth every 6 (six) hours as needed for moderate pain (pain score 4-6).   lansoprazole (PREVACID) 30 MG capsule Take 30 mg by mouth daily.   Magnesium Oxide 250 MG TABS Take 500 mg by mouth at bedtime.   meclizine (ANTIVERT) 25 MG tablet Take 25 mg by mouth 3 (three) times daily as needed for dizziness.   ondansetron  (ZOFRAN -ODT) 4 MG disintegrating tablet Take 1 tablet (4 mg total) by mouth every 8 (eight) hours as needed for nausea or vomiting.   rosuvastatin (CRESTOR) 20 MG tablet Take 20 mg by mouth daily.   spironolactone  (ALDACTONE ) 25 MG tablet Take 1 tablet (25 mg total) by mouth daily.   valsartan (DIOVAN) 320 MG tablet Take 320 mg by mouth daily.   vitamin C (ASCORBIC ACID) 500 MG tablet Take 500 mg by mouth at bedtime.     vitamin E 400 UNIT capsule Take 400 Units by mouth at bedtime.    [DISCONTINUED] amLODipine  (NORVASC ) 5 MG tablet Take 1 tablet (5 mg total) by mouth daily. Please schedule visit with general cardiology for future refills.    Allergies:   Codeine, Propoxyphene, Ace inhibitors, and Sulfa antibiotics   Social History   Socioeconomic History   Marital status: Widowed    Spouse name: Not on file   Number of children: Not on file   Years of education: Not on file   Highest education level: Not on file  Occupational History   Not on file  Tobacco Use   Smoking status: Never   Smokeless tobacco: Never  Substance and Sexual Activity   Alcohol use: No   Drug use: No   Sexual activity: Not on file  Other Topics Concern   Not on file  Social History Narrative   Not on file   Social Drivers of Health   Financial Resource Strain: Not on file  Food Insecurity: Not on file  Transportation Needs: Not on file  Physical Activity: Not on file  Stress: Not on file  Social Connections: Not on file     Family History:  The patient's family history includes Brain cancer in her brother; Cancer in her brother; Leukemia in her sister; Lung cancer in her father and paternal grandfather; Lymphoma in her brother and sister.  ROS:   12-point review of systems is negative unless otherwise noted in the HPI.   EKGs/Labs/Other Studies Reviewed:    Studies reviewed were summarized above. The additional studies were reviewed today:  ETT 11/29/2022:   ECG is normal. ECG rhythm shows normal sinus rhythm.   A Bruce protocol stress test was performed. Exercise capacity was moderately impaired. Patient exercised for 4 min and 20 sec. Maximum HR of 88 bpm. MPHR 63.0 %. Peak METS 5.5 . The patient experienced no angina during the test. Normal blood pressure and blunted heart rate response noted during stress. Heart rate recovery was normal.   No ST deviation was noted. Arrhythmias during recovery: rare PVCs.  ECG was interpretable and without significant changes. The ECG was not diagnostic due to failure to achieve 85% MAPHR. __________  Cardiac MRI 09/07/2022: IMPRESSION: 1. Normal LV size and systolic function. LVEF 62%. 2. Severe asymmetric LV basal septal hypertrophy, basal septal wall measuring 1.6 cm. 3. There is no late gadolinium enhancement in the left ventricular myocardium. 4.  Normal RV size and function. 5. Findings consistent with HCM asymmetric septal variant. no LVOT obstruction, no myocardial scar. __________  2D echo 08/26/2022: 1. Severe asymmetric septal hypertrophy, septal wall measuring 1.7 cm. no  LVOT obstruction. consider CMR for HCM eval.. Left ventricular ejection  fraction, by estimation, is 60 to 65%. The left ventricle has normal  function. The left ventricle has no  regional wall motion abnormalities. There is severe asymmetric left  ventricular hypertrophy of the septal segment. Left ventricular diastolic  parameters are consistent with Grade I diastolic dysfunction (impaired  relaxation).   2. Right ventricular systolic function is normal. The right ventricular  size is mildly enlarged. There is mildly elevated pulmonary artery  systolic pressure.   3. Left atrial size was moderately dilated.   4. Right atrial size was moderately dilated.   5. The mitral valve is degenerative. No evidence of mitral valve  regurgitation.   6. The aortic valve is tricuspid. Aortic valve regurgitation is mild to  moderate. Aortic valve sclerosis/calcification is present, without any  evidence of aortic stenosis.   7. The inferior vena cava is dilated in size with >50% respiratory  variability, suggesting right atrial pressure of 8 mmHg.  __________   Zio patch 06/2022: Patient had a min HR of 53 bpm, max HR of 164 bpm, and avg HR of 68 bpm. Predominant underlying rhythm was Sinus Rhythm.  27 Supraventricular Tachycardia runs occurred, the run with the fastest interval  lasting 5 beats with a max rate of 164 bpm, the longest lasting 15 beats with an avg rate of 110 bpm.  Rare PACs and rare PVCs. Most triggered events did not correlate with arrhythmia. __________   2D echo 11/30/2017: - Left ventricle: The cavity size was normal. Systolic function was    normal. The estimated ejection fraction was in the range of 60%    to 65%. Wall motion was normal; there were no regional wall    motion abnormalities. Doppler parameters are consistent with    abnormal left ventricular relaxation (grade  1 diastolic    dysfunction).  - Aortic valve: There was mild regurgitation.  - Left atrium: The atrium was normal in size.  - Right ventricle: Systolic function was normal.  - Pulmonary arteries: Systolic pressure was within the normal    range.    EKG:  EKG is not ordered today.    Recent Labs: 07/02/2023: BUN 25; Creatinine, Ser 0.85; Hemoglobin 10.1; Platelets 212; Potassium 3.7; Sodium 137  Recent Lipid Panel No results found for: "CHOL", "TRIG", "HDL", "CHOLHDL", "VLDL", "LDLCALC", "LDLDIRECT"  PHYSICAL EXAM:    VS:  BP (!) 142/84 (BP Location: Left Arm)   Pulse 80   Ht 5\' 4"  (1.626 m)   Wt 166 lb (75.3 kg)   SpO2 98%   BMI 28.49 kg/m   BMI: Body mass index is 28.49 kg/m.  Physical Exam Vitals reviewed.  Constitutional:      Appearance: She is well-developed.  HENT:     Head: Normocephalic and atraumatic.  Eyes:     General:        Right eye: No discharge.        Left eye: No discharge.  Cardiovascular:     Rate and Rhythm: Normal rate and regular rhythm.     Heart sounds: S1 normal and S2 normal. Heart sounds not distant. No midsystolic click and no opening snap. Murmur heard.     Systolic murmur is present with a grade of 1/6 at the upper right sternal border.     No friction rub.  Pulmonary:     Effort: Pulmonary effort is normal. No respiratory distress.     Breath sounds: Normal breath sounds. No decreased breath sounds, wheezing, rhonchi  or rales.  Chest:     Chest wall: No tenderness.  Musculoskeletal:     Cervical back: Normal range of motion.     Right lower leg: No edema.     Left lower leg: No edema.  Skin:    General: Skin is warm and dry.     Nails: There is no clubbing.  Neurological:     Mental Status: She is alert and oriented to person, place, and time.  Psychiatric:        Speech: Speech normal.        Behavior: Behavior normal.        Thought Content: Thought content normal.        Judgment: Judgment normal.     Wt Readings from Last 3 Encounters:  09/13/23 166 lb (75.3 kg)  11/09/22 156 lb (70.8 kg)  08/29/22 149 lb 9.6 oz (67.9 kg)     ASSESSMENT & PLAN:   Refractory hypertension: Blood pressure is reasonably controlled in the office today 142/84.  Blood pressure at home is typically in the 130s to 140s systolic.  Add 5 mg amlodipine  as needed for systolic blood pressure greater than 150 mmHg.  She will otherwise continue carvedilol  25 mg twice daily, spironolactone  25 mg daily, valsartan 320 mg daily.  Recent renal function and electrolytes stable.  Low-sodium diet is recommended.  HCM: Stable.  Followed by Dr. Paulita Boss.  PSVT: Quiescent on carvedilol  25 mg twice daily.  HLD: Remains on rosuvastatin 20 mg.  Followed by PCP.     Disposition: F/u with Dr. Alvenia Aus or an APP in 6 months.   Medication Adjustments/Labs and Tests Ordered: Current medicines are reviewed at length with the patient today.  Concerns regarding medicines are outlined above. Medication changes, Labs and Tests ordered today are summarized above  and listed in the Patient Instructions accessible in Encounters.   Signed, Varney Gentleman, PA-C 09/13/2023 8:59 AM     [facility] [place] [address] [place], [place] [address] [phone]

## 2023-09-13 ENCOUNTER — Ambulatory Visit: Attending: Physician Assistant | Admitting: Physician Assistant

## 2023-09-13 ENCOUNTER — Encounter: Payer: Self-pay | Admitting: Physician Assistant

## 2023-09-13 VITALS — BP 142/84 | HR 80 | Ht 64.0 in | Wt 166.0 lb

## 2023-09-13 DIAGNOSIS — E785 Hyperlipidemia, unspecified: Secondary | ICD-10-CM

## 2023-09-13 DIAGNOSIS — I1A0 Resistant hypertension: Secondary | ICD-10-CM | POA: Diagnosis not present

## 2023-09-13 DIAGNOSIS — I422 Other hypertrophic cardiomyopathy: Secondary | ICD-10-CM

## 2023-09-13 DIAGNOSIS — I471 Supraventricular tachycardia, unspecified: Secondary | ICD-10-CM

## 2023-09-13 MED ORDER — AMLODIPINE BESYLATE 5 MG PO TABS: 5.0000 mg | ORAL_TABLET | ORAL | 0 refills | Status: AC | PRN

## 2023-09-13 NOTE — Patient Instructions (Signed)
 Medication Instructions:  Your physician recommends the following medication changes.  START TAKING: Amlodipine  5 mg daily for your systolic (blood pressure top number) greater than 150  *If you need a refill on your cardiac medications before your next appointment, please call your pharmacy*  Lab Work: None ordered at this time   Follow-Up: At [facility], you and your health needs are our priority.  As part of our continuing mission to provide you with exceptional heart care, our providers are all part of one team.  This team includes your primary Cardiologist (physician) and Advanced Practice Providers or APPs (Physician Assistants and Nurse Practitioners) who all work together to provide you with the care you need, when you need it.  Your next appointment:   6 month(s)  Provider:   You may see Antionette Kirks, MD or Varney Gentleman, PA-C  We recommend signing up for the patient portal called "MyChart".  Sign up information is provided on this After Visit Summary.  MyChart is used to connect with patients for Virtual Visits (Telemedicine).  Patients are able to view lab/test results, encounter notes, upcoming appointments, etc.  Non-urgent messages can be sent to your provider as well.   To learn more about what you can do with MyChart, go to [email or web address].

## 2023-09-28 ENCOUNTER — Other Ambulatory Visit: Payer: Self-pay | Admitting: Internal Medicine

## 2023-11-29 ENCOUNTER — Ambulatory Visit: Admitting: Cardiovascular Disease

## 2023-12-21 ENCOUNTER — Other Ambulatory Visit: Payer: Self-pay | Admitting: Physician Assistant

## 2023-12-21 DIAGNOSIS — D320 Benign neoplasm of cerebral meninges: Secondary | ICD-10-CM

## 2023-12-27 ENCOUNTER — Ambulatory Visit
Admission: RE | Admit: 2023-12-27 | Discharge: 2023-12-27 | Disposition: A | Discharge status: Home / Self Care | Source: Ambulatory Visit | Attending: Physician Assistant | Admitting: Physician Assistant

## 2023-12-27 DIAGNOSIS — D320 Benign neoplasm of cerebral meninges: Secondary | ICD-10-CM | POA: Insufficient documentation

## 2023-12-27 MED ORDER — GADOBUTROL 1 MMOL/ML IV SOLN
7.5000 mL | Freq: Once | INTRAVENOUS | Status: AC | PRN
  Administered 2023-12-27: 7.5 mL via INTRAVENOUS

## 2024-05-10 ENCOUNTER — Ambulatory Visit: Admitting: Physician Assistant

## 2024-06-03 NOTE — Progress Notes (Unsigned)
 "  Cardiology Office Note    Date:  06/06/2024   ID:  Cindy Macdonald, DOB 02-Jun-1940, MRN [id number]  PCP:  Montey Lot, PA-C  Cardiologist:  Deatrice Cage, MD  Electrophysiologist:  None   Chief Complaint: Follow up  History of Present Illness:   Cindy Macdonald is a 84 y.o. 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  She was seen in 2019 for resistant hypertension with negative secondary hypertension workup.  At that time metoprolol was transitioned to carvedilol  and spironolactone  was added to HCTZ.  She had episodes of hypotension following this leading spironolactone  to be discontinued.  She reestablished care in 06/2022 noting significant fluctuation of blood pressure readings can be close to 200 with significant drop following one dose of clonidine .  She reported feeling sluggish and noted intermittent palpitations with shortness of breath without frank chest pain.  Blood pressure in the office was 120/80 with heart rate of 56 bpm.  It was felt clonidine  was likely contributing to her fatigue and significant fluctuation in blood pressure.  This medication was discontinued.  Diltiazem  was decreased to 120 mg daily with recommendation to transition to amlodipine  in the future if heart rate remained stable.  Torsemide was discontinued.  Spironolactone  25 mg was added back.  Zio patch showed a predominant rhythm of sinus with an average rate of 68 bpm (range 53 to 164 bpm), 27 episodes of SVT with the fastest interval lasting 5 beats and the longest interval lasting 15 beats.  Rare PACs and PVCs.  Most triggered events did not correlate with arrhythmia.  Echo in 08/2022 showed an EF of 60 to 65%, severe asymmetric septal hypertrophy with septal wall measuring 1.7 cm with no LVOT obstruction, normal wall motion,  grade 1 diastolic dysfunction, normal RV systolic function with mildly enlarged ventricular cavity size, mildly elevated PASP, moderate biatrial enlargement, mild to moderate aortic insufficiency, aortic valve sclerosis without evidence of stenosis, and an estimated right atrial pressure of 8 mmHg.  She was seen by general cardiology in 08/2022 and reported less fluctuation in BP since making the above changes.  Cardiac MRI in 08/2022 that showed an EF of 62% with severe asymmetric LV basal septal hypertrophy with no LGE and normal RV systolic function and ventricular cavity size.  Findings were consistent with HCM septal variant only chordal SAM and without LVOT obstruction.  She was evaluated by Dr. Santo in 10/2022 for HCM with ETT in 11/2022 showing no evidence of exercise-induced hypotension or syncope.  She was last seen by general cardiology in 08/2023 and was doing well from a cardiac perspective with stable chronic fatigue.  She was started on as needed amlodipine  5 mg for blood pressure greater than 150 mmHg with continuation of carvedilol , spironolactone , and valsartan.  She comes in doing well from a cardiac perspective and is without symptoms of angina or cardiac decompensation.  Chronic fatigue is stable, she reports she has good days and bad days.  Remains active and continues to work intermittently.  She reports PCP increased spironolactone  to 50 mg in the morning due to elevated BP readings, this has helped significantly with readings typically in the 1 teens at home.  Prior to titration of spironolactone  she was taking 5 mg of amlodipine  daily as BPs were typically greater than 150 mmHg systolic.  With daily amlodipine   and she did notice some lower extremity swelling.  However, she has been out of spironolactone  as the pharmacy did not have her refill.  No dizziness, presyncope, or syncope.   Labs independently reviewed: 06/2023 - potassium 3.7, BUN 25, serum creatinine 0.85 Hgb 10.1, PLT  212 01/2019 - albumin 3.8, AST/ALT normal  Past Medical History:  Diagnosis Date   Barrett's esophagus    Benign tumor of parathyroid gland    Colon polyps    COPD (chronic obstructive pulmonary disease) (HCC)    GERD (gastroesophageal reflux disease)    Hx of breast implants, bilateral    Hyperlipidemia    Hypertension     Past Surgical History:  Procedure Laterality Date   COLONOSCOPY  2015   Dr Geryl   COLONOSCOPY WITH PROPOFOL  N/A 03/06/2019   Procedure: COLONOSCOPY WITH PROPOFOL ;  Surgeon: Dessa Reyes ORN, MD;  Location: [facility] ENDOSCOPY;  Service: Endoscopy;  Laterality: N/A;   ESOPHAGOGASTRODUODENOSCOPY (EGD) WITH PROPOFOL  N/A 03/06/2019   Procedure: ESOPHAGOGASTRODUODENOSCOPY (EGD) WITH PROPOFOL ;  Surgeon: Dessa Reyes ORN, MD;  Location: [facility] ENDOSCOPY;  Service: Endoscopy;  Laterality: N/A;   HEMORRHOID SURGERY     PARATHYROIDECTOMY     PLACEMENT OF BREAST IMPLANTS     age 54   UPPER GI ENDOSCOPY     dr Viktoria    Current Medications: Active Medications[1]  Allergies:   Codeine, Propoxyphene, Ace inhibitors, and Sulfa antibiotics   Social History   Socioeconomic History   Marital status: Widowed    Spouse name: Not on file   Number of children: Not on file   Years of education: Not on file   Highest education level: Not on file  Occupational History   Not on file  Tobacco Use   Smoking status: Never   Smokeless tobacco: Never  Substance and Sexual Activity   Alcohol use: No   Drug use: No   Sexual activity: Not on file  Other Topics Concern   Not on file  Social History Narrative   Not on file   Social Drivers of Health   Tobacco Use: Low Risk (06/06/2024)   Patient History    Smoking Tobacco Use: Never    Smokeless Tobacco Use: Never    Passive Exposure: Not on file  Financial Resource Strain: Not on file  Food Insecurity: Not on file  Transportation Needs: Not on file  Physical Activity: Not on file  Stress: Not on file  Social  Connections: Not on file  Depression (EYV7-0): Not on file  Alcohol Screen: Not on file  Housing: Not on file  Utilities: Not on file  Health Literacy: Not on file     Family History:  The patient's family history includes Brain cancer in her brother; Cancer in her brother; Leukemia in her sister; Lung cancer in her father and paternal grandfather; Lymphoma in her brother and sister.  ROS:   12-point review of systems is negative unless otherwise noted in the HPI.   EKGs/Labs/Other Studies Reviewed:    Studies reviewed were summarized above. The additional studies were reviewed today:  ETT 11/29/2022:   ECG is normal. ECG rhythm shows normal sinus rhythm.   A Bruce protocol stress test was performed. Exercise capacity was moderately impaired. Patient exercised for 4 min and 20 sec. Maximum HR of 88 bpm. MPHR 63.0 %. Peak METS 5.5 . The patient experienced no angina during the test. Normal blood pressure and blunted heart rate response noted during stress. Heart rate recovery was normal.  No ST deviation was noted. Arrhythmias during recovery: rare PVCs. ECG was interpretable and without significant changes. The ECG was not diagnostic due to failure to achieve 85% MAPHR. __________   Cardiac MRI 09/07/2022: IMPRESSION: 1. Normal LV size and systolic function. LVEF 62%. 2. Severe asymmetric LV basal septal hypertrophy, basal septal wall measuring 1.6 cm. 3. There is no late gadolinium enhancement in the left ventricular myocardium. 4.  Normal RV size and function. 5. Findings consistent with HCM asymmetric septal variant. no LVOT obstruction, no myocardial scar. __________   2D echo 08/26/2022: 1. Severe asymmetric septal hypertrophy, septal wall measuring 1.7 cm. no  LVOT obstruction. consider CMR for HCM eval.. Left ventricular ejection  fraction, by estimation, is 60 to 65%. The left ventricle has normal  function. The left ventricle has no  regional wall motion abnormalities.  There is severe asymmetric left  ventricular hypertrophy of the septal segment. Left ventricular diastolic  parameters are consistent with Grade I diastolic dysfunction (impaired  relaxation).   2. Right ventricular systolic function is normal. The right ventricular  size is mildly enlarged. There is mildly elevated pulmonary artery  systolic pressure.   3. Left atrial size was moderately dilated.   4. Right atrial size was moderately dilated.   5. The mitral valve is degenerative. No evidence of mitral valve  regurgitation.   6. The aortic valve is tricuspid. Aortic valve regurgitation is mild to  moderate. Aortic valve sclerosis/calcification is present, without any  evidence of aortic stenosis.   7. The inferior vena cava is dilated in size with >50% respiratory  variability, suggesting right atrial pressure of 8 mmHg.  __________   Zio patch 06/2022: Patient had a min HR of 53 bpm, max HR of 164 bpm, and avg HR of 68 bpm. Predominant underlying rhythm was Sinus Rhythm.  27 Supraventricular Tachycardia runs occurred, the run with the fastest interval lasting 5 beats with a max rate of 164 bpm, the longest lasting 15 beats with an avg rate of 110 bpm.  Rare PACs and rare PVCs. Most triggered events did not correlate with arrhythmia. __________   2D echo 11/30/2017: - Left ventricle: The cavity size was normal. Systolic function was    normal. The estimated ejection fraction was in the range of 60%    to 65%. Wall motion was normal; there were no regional wall    motion abnormalities. Doppler parameters are consistent with    abnormal left ventricular relaxation (grade 1 diastolic    dysfunction).  - Aortic valve: There was mild regurgitation.  - Left atrium: The atrium was normal in size.  - Right ventricle: Systolic function was normal.  - Pulmonary arteries: Systolic pressure was within the normal    range.    EKG:  EKG is ordered today.  The EKG ordered today demonstrates  NSR, 64 bpm, left atrial enlargement, possible prior inferior and anterior infarcts, no acute ST-T changes, consistent with prior tracings  Recent Labs: 07/02/2023: BUN 25; Creatinine, Ser 0.85; Hemoglobin 10.1; Platelets 212; Potassium 3.7; Sodium 137  Recent Lipid Panel No results found for: CHOL, TRIG, HDL, CHOLHDL, VLDL, LDLCALC, LDLDIRECT  PHYSICAL EXAM:    VS:  BP (!) 140/80 (BP Location: Left Arm, Patient Position: Sitting, Cuff Size: Normal)   Pulse 64 Comment: 73 oximeter  Ht 5' 3 (1.6 m)   Wt 168 lb 3.2 oz (76.3 kg)   SpO2 99%   BMI 29.80 kg/m   BMI: Body mass index is 29.8 kg/m.  Physical Exam  Wt Readings from Last 3 Encounters:  06/06/24 168 lb 3.2 oz (76.3 kg)  12/27/23 166 lb (75.3 kg)  09/13/23 166 lb (75.3 kg)     ASSESSMENT & PLAN:   Refractory hypertension: Blood pressure is mildly elevated in the office today, though she has been without spironolactone .  She indicates PCP recently increased spironolactone  to 50 mg daily with continuation of carvedilol  25 mg twice daily, valsartan 320 mg daily, and amlodipine  5 mg daily as needed for systolic blood pressure greater than 150 mmHg.  Check BMP.  Low-sodium diet recommended.  Aortic insufficiency: Mild to moderate aortic valve insufficiency by echo in 08/2022 aortic insufficiency by cardiac MRI at time.  Update echo.  HCM: Stable.  No symptoms of dizziness, near-syncope, or syncope.  Felt to be low risk for SCD.  Has previously been advised to have family screened.  Followed by HCM clinic.  Overdue for follow-up, schedule.  PSVT: Quiescent on carvedilol  25 mg twice daily.  HLD: Remains on rosuvastatin 20 mg.  Followed by PCP.  Check LFT and lipid panel.  Chronic fatigue: Overall stable.  Check TSH and CBC.    Disposition: F/u with Dr. Darron or an APP in 6 months.   Medication Adjustments/Labs and Tests Ordered: Current medicines are reviewed at length with the patient today.  Concerns regarding  medicines are outlined above. Medication changes, Labs and Tests ordered today are summarized above and listed in the Patient Instructions accessible in Encounters.   Signed, Bernardino Bring, PA-C 06/06/2024 12:43 PM     [facility] [place] [address] [place], [place] [address] [phone]     [1]  Current Meds  Medication Sig   acetaminophen  (TYLENOL ) 500 MG tablet Take 1,000 mg by mouth every 6 (six) hours as needed for headache (pain).    albuterol (PROVENTIL HFA;VENTOLIN HFA) 108 (90 Base) MCG/ACT inhaler Inhale 2 puffs into the lungs every 6 (six) hours as needed for wheezing or shortness of breath.    amLODipine  (NORVASC ) 5 MG tablet Take 1 tablet (5 mg total) by mouth as needed. Take one (1) tablet as needed for SBP over 150   carvedilol  (COREG ) 25 MG tablet Take 25 mg by mouth 2 (two) times daily.   clonazePAM (KLONOPIN) 0.5 MG tablet Take 0.5 mg by mouth 2 (two) times daily as needed for anxiety.   cyclobenzaprine (FLEXERIL) 5 MG tablet Take 5 mg by mouth at bedtime as needed.   Flaxseed, Linseed, (FLAXSEED OIL) 1000 MG CAPS Take 1,000 mg by mouth at bedtime.   gabapentin (NEURONTIN) 600 MG tablet Take 600 mg by mouth at bedtime.   HYDROcodone -acetaminophen  (NORCO/VICODIN) 5-325 MG tablet Take 1 tablet by mouth every 6 (six) hours as needed for moderate pain (pain score 4-6).   lansoprazole (PREVACID) 30 MG capsule Take 30 mg by mouth daily.   Magnesium Oxide 250 MG TABS Take 500 mg by mouth at bedtime.   meclizine (ANTIVERT) 25 MG tablet Take 25 mg by mouth 3 (three) times daily as needed for dizziness.   ondansetron  (ZOFRAN -ODT) 4 MG disintegrating tablet Take 1 tablet (4 mg total) by mouth every 8 (eight) hours as needed for nausea or vomiting.   rosuvastatin (CRESTOR) 20 MG tablet Take 20 mg by mouth daily.   spironolactone  (ALDACTONE ) 25 MG tablet Take 1 tablet (25 mg total) by mouth daily. (Patient taking differently: Take 50 mg by mouth daily.)    valsartan (DIOVAN) 320 MG tablet Take  320 mg by mouth daily.   vitamin C (ASCORBIC ACID) 500 MG tablet Take 500 mg by mouth at bedtime.    vitamin E 400 UNIT capsule Take 400 Units by mouth at bedtime.    "

## 2024-06-06 ENCOUNTER — Ambulatory Visit: Attending: Physician Assistant | Admitting: Physician Assistant

## 2024-06-06 ENCOUNTER — Encounter: Payer: Self-pay | Admitting: Physician Assistant

## 2024-06-06 VITALS — BP 140/80 | HR 64 | Ht 63.0 in | Wt 168.2 lb

## 2024-06-06 DIAGNOSIS — I1A0 Resistant hypertension: Secondary | ICD-10-CM

## 2024-06-06 DIAGNOSIS — I422 Other hypertrophic cardiomyopathy: Secondary | ICD-10-CM | POA: Diagnosis not present

## 2024-06-06 DIAGNOSIS — I471 Supraventricular tachycardia, unspecified: Secondary | ICD-10-CM | POA: Diagnosis not present

## 2024-06-06 DIAGNOSIS — E785 Hyperlipidemia, unspecified: Secondary | ICD-10-CM | POA: Diagnosis not present

## 2024-06-06 DIAGNOSIS — I10 Essential (primary) hypertension: Secondary | ICD-10-CM

## 2024-06-06 DIAGNOSIS — Z79899 Other long term (current) drug therapy: Secondary | ICD-10-CM | POA: Diagnosis not present

## 2024-06-06 DIAGNOSIS — I351 Nonrheumatic aortic (valve) insufficiency: Secondary | ICD-10-CM | POA: Diagnosis not present

## 2024-06-06 NOTE — Patient Instructions (Signed)
 Medication Instructions:  Your physician recommends that you continue on your current medications as directed. Please refer to the Current Medication list given to you today.   *If you need a refill on your cardiac medications before your next appointment, please call your pharmacy*  Lab Work: Your provider would like for you to have following labs drawn today CMeT, Lipids, CBC, and TSH.   If you have labs (blood work) drawn today and your tests are completely normal, you will receive your results only by: MyChart Message (if you have MyChart) OR A paper copy in the mail If you have any lab test that is abnormal or we need to change your treatment, we will call you to review the results.  Testing/Procedures: Your physician has requested that you have an echocardiogram. Echocardiography is a painless test that uses sound waves to create images of your heart. It provides your doctor with information about the size and shape of your heart and how well your hearts chambers and valves are working.   You may receive an ultrasound enhancing agent through an IV if needed to better visualize your heart during the echo. This procedure takes approximately one hour.  There are no restrictions for this procedure.  This will take place at [address] [facility]) [address], [place] [address]  Please note: We ask at that you not bring children with you during ultrasound (echo/ vascular) testing. Due to room size and safety concerns, children are not allowed in the ultrasound rooms during exams. Our front office staff cannot provide observation of children in our lobby area while testing is being conducted. An adult accompanying a patient to their appointment will only be allowed in the ultrasound room at the discretion of the ultrasound technician under special circumstances. We apologize for any inconvenience.   Follow-Up: At [facility], you and your health needs are our priority.   As part of our continuing mission to provide you with exceptional heart care, our providers are all part of one team.  This team includes your primary Cardiologist (physician) and Advanced Practice Providers or APPs (Physician Assistants and Nurse Practitioners) who all work together to provide you with the care you need, when you need it.  Your next appointment:   6 month(s)  Provider:   You may see Deatrice Cage, MD or Bernardino Bring, PA-C   We recommend signing up for the patient portal called MyChart.  Sign up information is provided on this After Visit Summary.  MyChart is used to connect with patients for Virtual Visits (Telemedicine).  Patients are able to view lab/test results, encounter notes, upcoming appointments, etc.  Non-urgent messages can be sent to your provider as well.   To learn more about what you can do with MyChart, go to [email or web address].

## 2024-06-07 ENCOUNTER — Ambulatory Visit: Payer: Self-pay | Admitting: Physician Assistant

## 2024-06-07 LAB — COMPREHENSIVE METABOLIC PANEL WITH GFR
ALT: 10 IU/L (ref 0–32)
AST: 14 IU/L (ref 0–40)
Albumin: 4.5 g/dL (ref 3.7–4.7)
Alkaline Phosphatase: 60 IU/L (ref 48–129)
BUN/Creatinine Ratio: 23 (ref 12–28)
BUN: 22 mg/dL (ref 8–27)
Bilirubin Total: 0.4 mg/dL (ref 0.0–1.2)
CO2: 24 mmol/L (ref 20–29)
Calcium: 9.5 mg/dL (ref 8.7–10.3)
Chloride: 100 mmol/L (ref 96–106)
Creatinine, Ser: 0.97 mg/dL (ref 0.57–1.00)
Globulin, Total: 2.1 g/dL (ref 1.5–4.5)
Glucose: 108 mg/dL — ABNORMAL HIGH (ref 70–99)
Potassium: 4.6 mmol/L (ref 3.5–5.2)
Sodium: 139 mmol/L (ref 134–144)
Total Protein: 6.6 g/dL (ref 6.0–8.5)
eGFR: 58 mL/min/1.73 — ABNORMAL LOW

## 2024-06-07 LAB — CBC
Hematocrit: 41.7 % (ref 34.0–46.6)
Hemoglobin: 13.3 g/dL (ref 11.1–15.9)
MCH: 30.5 pg (ref 26.6–33.0)
MCHC: 31.9 g/dL (ref 31.5–35.7)
MCV: 96 fL (ref 79–97)
Platelets: 195 x10E3/uL (ref 150–450)
RBC: 4.36 x10E6/uL (ref 3.77–5.28)
RDW: 13.9 % (ref 11.7–15.4)
WBC: 3.2 x10E3/uL — ABNORMAL LOW (ref 3.4–10.8)

## 2024-06-07 LAB — LIPID PANEL
Chol/HDL Ratio: 3.1 ratio (ref 0.0–4.4)
Cholesterol, Total: 151 mg/dL (ref 100–199)
HDL: 48 mg/dL
LDL Chol Calc (NIH): 84 mg/dL (ref 0–99)
Triglycerides: 103 mg/dL (ref 0–149)
VLDL Cholesterol Cal: 19 mg/dL (ref 5–40)

## 2024-06-07 LAB — TSH: TSH: 3.54 u[IU]/mL (ref 0.450–4.500)

## 2024-07-30 ENCOUNTER — Ambulatory Visit
